# Patient Record
Sex: Male | Born: 1995 | Race: White | Hispanic: No | Marital: Single | Smoking: Never smoker
Health system: Southern US, Community
[De-identification: ages and names within clinical notes are randomized; demographics above are authoritative.]

## PROBLEM LIST (undated history)

## (undated) DIAGNOSIS — F1290 Cannabis use, unspecified, uncomplicated: Secondary | ICD-10-CM

## (undated) DIAGNOSIS — I2699 Other pulmonary embolism without acute cor pulmonale: Secondary | ICD-10-CM

## (undated) DIAGNOSIS — I82409 Acute embolism and thrombosis of unspecified deep veins of unspecified lower extremity: Secondary | ICD-10-CM

## (undated) HISTORY — PX: MASTECTOMY: SHX3

## (undated) HISTORY — PX: OTHER SURGICAL HISTORY: SHX169

---

## 2019-01-24 ENCOUNTER — Emergency Department (HOSPITAL_COMMUNITY): Payer: Self-pay

## 2019-01-24 ENCOUNTER — Inpatient Hospital Stay (HOSPITAL_COMMUNITY): Payer: Self-pay

## 2019-01-24 ENCOUNTER — Encounter (HOSPITAL_COMMUNITY): Payer: Self-pay | Admitting: Emergency Medicine

## 2019-01-24 ENCOUNTER — Other Ambulatory Visit: Payer: Self-pay

## 2019-01-24 ENCOUNTER — Inpatient Hospital Stay (HOSPITAL_COMMUNITY)
Admission: EM | Admit: 2019-01-24 | Discharge: 2019-01-30 | DRG: 175 | Disposition: A | Discharge status: Home / Self Care | Payer: Self-pay | Attending: Family Medicine | Admitting: Family Medicine

## 2019-01-24 DIAGNOSIS — Y990 Civilian activity done for income or pay: Secondary | ICD-10-CM

## 2019-01-24 DIAGNOSIS — N179 Acute kidney failure, unspecified: Secondary | ICD-10-CM | POA: Diagnosis present

## 2019-01-24 DIAGNOSIS — W19XXXA Unspecified fall, initial encounter: Secondary | ICD-10-CM | POA: Diagnosis present

## 2019-01-24 DIAGNOSIS — I2602 Saddle embolus of pulmonary artery with acute cor pulmonale: Secondary | ICD-10-CM

## 2019-01-24 DIAGNOSIS — I82409 Acute embolism and thrombosis of unspecified deep veins of unspecified lower extremity: Secondary | ICD-10-CM | POA: Diagnosis present

## 2019-01-24 DIAGNOSIS — E669 Obesity, unspecified: Secondary | ICD-10-CM | POA: Diagnosis present

## 2019-01-24 DIAGNOSIS — I2699 Other pulmonary embolism without acute cor pulmonale: Secondary | ICD-10-CM | POA: Diagnosis present

## 2019-01-24 DIAGNOSIS — I371 Nonrheumatic pulmonary valve insufficiency: Secondary | ICD-10-CM

## 2019-01-24 DIAGNOSIS — R55 Syncope and collapse: Secondary | ICD-10-CM | POA: Diagnosis present

## 2019-01-24 DIAGNOSIS — I82412 Acute embolism and thrombosis of left femoral vein: Secondary | ICD-10-CM | POA: Diagnosis present

## 2019-01-24 DIAGNOSIS — Z6833 Body mass index (BMI) 33.0-33.9, adult: Secondary | ICD-10-CM

## 2019-01-24 DIAGNOSIS — F1290 Cannabis use, unspecified, uncomplicated: Secondary | ICD-10-CM

## 2019-01-24 DIAGNOSIS — I361 Nonrheumatic tricuspid (valve) insufficiency: Secondary | ICD-10-CM

## 2019-01-24 DIAGNOSIS — Z20828 Contact with and (suspected) exposure to other viral communicable diseases: Secondary | ICD-10-CM | POA: Diagnosis present

## 2019-01-24 DIAGNOSIS — R739 Hyperglycemia, unspecified: Secondary | ICD-10-CM | POA: Diagnosis present

## 2019-01-24 DIAGNOSIS — F17210 Nicotine dependence, cigarettes, uncomplicated: Secondary | ICD-10-CM | POA: Diagnosis present

## 2019-01-24 DIAGNOSIS — R0902 Hypoxemia: Secondary | ICD-10-CM | POA: Diagnosis present

## 2019-01-24 DIAGNOSIS — I2609 Other pulmonary embolism with acute cor pulmonale: Principal | ICD-10-CM | POA: Diagnosis present

## 2019-01-24 DIAGNOSIS — S40212A Abrasion of left shoulder, initial encounter: Secondary | ICD-10-CM | POA: Diagnosis present

## 2019-01-24 HISTORY — PX: IR INFUSION THROMBOL ARTERIAL INITIAL (MS): IMG5376

## 2019-01-24 HISTORY — PX: IR ANGIOGRAM PULMONARY BILATERAL SELECTIVE: IMG664

## 2019-01-24 HISTORY — DX: Cannabis use, unspecified, uncomplicated: F12.90

## 2019-01-24 HISTORY — PX: IR ANGIOGRAM SELECTIVE EACH ADDITIONAL VESSEL: IMG667

## 2019-01-24 HISTORY — PX: IR US GUIDE VASC ACCESS RIGHT: IMG2390

## 2019-01-24 LAB — CBC
HCT: 48.5 % (ref 39.0–52.0)
HCT: 46.4 % (ref 39.0–52.0)
Hemoglobin: 16.3 g/dL (ref 13.0–17.0)
Hemoglobin: 15.6 g/dL (ref 13.0–17.0)
MCH: 30.5 pg (ref 26.0–34.0)
MCH: 30.6 pg (ref 26.0–34.0)
MCHC: 33.6 g/dL (ref 30.0–36.0)
MCHC: 33.6 g/dL (ref 30.0–36.0)
MCV: 90.8 fL (ref 80.0–100.0)
MCV: 91 fL (ref 80.0–100.0)
Platelets: 165 10*3/uL (ref 150–400)
Platelets: 204 10*3/uL (ref 150–400)
RBC: 5.34 MIL/uL (ref 4.22–5.81)
RBC: 5.1 MIL/uL (ref 4.22–5.81)
RDW: 13.1 % (ref 11.5–15.5)
RDW: 13.3 % (ref 11.5–15.5)
WBC: 10 10*3/uL (ref 4.0–10.5)
WBC: 8.9 10*3/uL (ref 4.0–10.5)
nRBC: 0 % (ref 0.0–0.2)
nRBC: 0 % (ref 0.0–0.2)

## 2019-01-24 LAB — HEPARIN LEVEL (UNFRACTIONATED): Heparin Unfractionated: 0.23 IU/mL — ABNORMAL LOW (ref 0.30–0.70)

## 2019-01-24 LAB — RAPID URINE DRUG SCREEN, HOSP PERFORMED
Amphetamines: NOT DETECTED
Barbiturates: NOT DETECTED
Benzodiazepines: NOT DETECTED
Cocaine: NOT DETECTED
Opiates: NOT DETECTED
Tetrahydrocannabinol: POSITIVE — AB

## 2019-01-24 LAB — ECHOCARDIOGRAM COMPLETE
Height: 73 in
Weight: 4063.52 oz

## 2019-01-24 LAB — URINALYSIS, ROUTINE W REFLEX MICROSCOPIC
Bilirubin Urine: NEGATIVE
Glucose, UA: 50 mg/dL — AB
Hgb urine dipstick: NEGATIVE
Ketones, ur: NEGATIVE mg/dL
Leukocytes,Ua: NEGATIVE
Nitrite: NEGATIVE
Protein, ur: 100 mg/dL — AB
Specific Gravity, Urine: 1.023 (ref 1.005–1.030)
pH: 5 (ref 5.0–8.0)

## 2019-01-24 LAB — BASIC METABOLIC PANEL
Anion gap: 11 (ref 5–15)
BUN: 12 mg/dL (ref 6–20)
CO2: 22 mmol/L (ref 22–32)
Calcium: 9.2 mg/dL (ref 8.9–10.3)
Chloride: 104 mmol/L (ref 98–111)
Creatinine, Ser: 1.25 mg/dL — ABNORMAL HIGH (ref 0.61–1.24)
GFR calc Af Amer: >60 mL/min (ref >60)
GFR calc non Af Amer: >60 mL/min (ref >60)
Glucose, Bld: 211 mg/dL — ABNORMAL HIGH (ref 70–99)
Potassium: 4.4 mmol/L (ref 3.5–5.1)
Sodium: 137 mmol/L (ref 135–145)

## 2019-01-24 LAB — FIBRINOGEN: Fibrinogen: 314 mg/dL (ref 210–475)

## 2019-01-24 LAB — SARS CORONAVIRUS 2 (TAT 6-24 HRS): SARS Coronavirus 2: NEGATIVE

## 2019-01-24 LAB — D-DIMER, QUANTITATIVE: D-Dimer, Quant: 17.8 ug/mL-FEU — ABNORMAL HIGH (ref 0.00–0.50)

## 2019-01-24 LAB — HEMOGLOBIN A1C
Hgb A1c MFr Bld: 5.4 % (ref 4.8–5.6)
Mean Plasma Glucose: 108.28 mg/dL

## 2019-01-24 LAB — ANTITHROMBIN III: AntiThromb III Func: 101 % (ref 75–120)

## 2019-01-24 LAB — CBG MONITORING, ED: Glucose-Capillary: 178 mg/dL — ABNORMAL HIGH (ref 70–99)

## 2019-01-24 LAB — PROTIME-INR
INR: 1.1 (ref 0.8–1.2)
Prothrombin Time: 14.1 seconds (ref 11.4–15.2)

## 2019-01-24 LAB — APTT: aPTT: 21 seconds — ABNORMAL LOW (ref 24–36)

## 2019-01-24 MED ORDER — CHLORHEXIDINE GLUCONATE CLOTH 2 % EX PADS
6.0000 | MEDICATED_PAD | Freq: Every day | CUTANEOUS | Status: DC
  Administered 2019-01-25 – 2019-01-26 (×2): 6 via TOPICAL

## 2019-01-24 MED ORDER — ACETAMINOPHEN 325 MG PO TABS
650.0000 mg | ORAL_TABLET | Freq: Four times a day (QID) | ORAL | Status: DC | PRN
  Administered 2019-01-24: 650 mg via ORAL
  Filled 2019-01-24: qty 2

## 2019-01-24 MED ORDER — SODIUM CHLORIDE 0.9 % IV SOLN: 250.0000 mL | INTRAVENOUS | Status: DC | PRN

## 2019-01-24 MED ORDER — SODIUM CHLORIDE 0.9 % IV SOLN
12.0000 mg | Freq: Once | INTRAVENOUS | Status: AC
  Administered 2019-01-24: 12 mg via INTRAVENOUS
  Filled 2019-01-24: qty 12

## 2019-01-24 MED ORDER — SODIUM CHLORIDE 0.9% FLUSH
3.0000 mL | Freq: Two times a day (BID) | INTRAVENOUS | Status: DC
  Administered 2019-01-25 – 2019-01-29 (×3): 3 mL via INTRAVENOUS

## 2019-01-24 MED ORDER — FENTANYL CITRATE (PF) 100 MCG/2ML IJ SOLN
INTRAMUSCULAR | Status: AC
  Filled 2019-01-24: qty 2

## 2019-01-24 MED ORDER — SODIUM CHLORIDE 0.9 % IV SOLN
INTRAVENOUS | Status: DC
  Administered 2019-01-25: 08:00:00 via INTRAVENOUS

## 2019-01-24 MED ORDER — IOHEXOL 300 MG/ML  SOLN
50.0000 mL | Freq: Once | INTRAMUSCULAR | Status: AC | PRN
  Administered 2019-01-24: 10 mL via INTRA_ARTERIAL

## 2019-01-24 MED ORDER — MIDAZOLAM HCL 2 MG/2ML IJ SOLN
INTRAMUSCULAR | Status: AC | PRN
  Administered 2019-01-24: 1 mg via INTRAVENOUS

## 2019-01-24 MED ORDER — IOHEXOL 350 MG/ML SOLN
80.0000 mL | Freq: Once | INTRAVENOUS | Status: AC | PRN
  Administered 2019-01-24: 68 mL via INTRAVENOUS

## 2019-01-24 MED ORDER — SODIUM CHLORIDE 0.9 % IV SOLN: INTRAVENOUS | Status: DC

## 2019-01-24 MED ORDER — LIDOCAINE HCL 1 % IJ SOLN
INTRAMUSCULAR | Status: AC
  Filled 2019-01-24: qty 20

## 2019-01-24 MED ORDER — ONDANSETRON HCL 4 MG PO TABS: 4.0000 mg | ORAL_TABLET | Freq: Four times a day (QID) | ORAL | Status: DC | PRN

## 2019-01-24 MED ORDER — ALBUTEROL SULFATE (2.5 MG/3ML) 0.083% IN NEBU: 2.5000 mg | INHALATION_SOLUTION | Freq: Four times a day (QID) | RESPIRATORY_TRACT | Status: DC | PRN

## 2019-01-24 MED ORDER — ACETAMINOPHEN 650 MG RE SUPP: 650.0000 mg | Freq: Four times a day (QID) | RECTAL | Status: DC | PRN

## 2019-01-24 MED ORDER — SODIUM CHLORIDE 0.9% FLUSH: 3.0000 mL | INTRAVENOUS | Status: DC | PRN

## 2019-01-24 MED ORDER — ALTEPLASE 2 MG IJ SOLR
INTRAMUSCULAR | Status: AC
  Filled 2019-01-24: qty 4

## 2019-01-24 MED ORDER — HYDROCODONE-ACETAMINOPHEN 5-325 MG PO TABS
1.0000 | ORAL_TABLET | Freq: Four times a day (QID) | ORAL | Status: DC | PRN
  Administered 2019-01-24 – 2019-01-25 (×2): 1 via ORAL
  Filled 2019-01-24 (×2): qty 1

## 2019-01-24 MED ORDER — LIDOCAINE HCL 1 % IJ SOLN
INTRAMUSCULAR | Status: AC | PRN
  Administered 2019-01-24: 5 mL

## 2019-01-24 MED ORDER — HEPARIN (PORCINE) 25000 UT/250ML-% IV SOLN
1850.0000 [IU]/h | INTRAVENOUS | Status: DC
  Administered 2019-01-24: 1750 [IU]/h via INTRAVENOUS
  Administered 2019-01-24 – 2019-01-28 (×7): 1850 [IU]/h via INTRAVENOUS
  Filled 2019-01-24 (×10): qty 250

## 2019-01-24 MED ORDER — ONDANSETRON HCL 4 MG/2ML IJ SOLN: 4.0000 mg | Freq: Four times a day (QID) | INTRAMUSCULAR | Status: DC | PRN

## 2019-01-24 MED ORDER — HEPARIN BOLUS VIA INFUSION
6500.0000 [IU] | Freq: Once | INTRAVENOUS | Status: AC
  Administered 2019-01-24: 6500 [IU] via INTRAVENOUS
  Filled 2019-01-24: qty 6500

## 2019-01-24 MED ORDER — MIDAZOLAM HCL 2 MG/2ML IJ SOLN
INTRAMUSCULAR | Status: AC
  Filled 2019-01-24: qty 4

## 2019-01-24 MED ORDER — SODIUM CHLORIDE 0.9 % IV BOLUS
1000.0000 mL | Freq: Once | INTRAVENOUS | Status: AC
  Administered 2019-01-24: 1000 mL via INTRAVENOUS

## 2019-01-24 MED ORDER — SODIUM CHLORIDE 0.9% FLUSH
3.0000 mL | Freq: Once | INTRAVENOUS | Status: AC
  Administered 2019-01-24: 3 mL via INTRAVENOUS

## 2019-01-24 MED ORDER — SODIUM CHLORIDE 0.9 % IV SOLN
INTRAVENOUS | Status: DC
  Administered 2019-01-24 – 2019-01-28 (×8): via INTRAVENOUS

## 2019-01-24 MED ORDER — ALTEPLASE 2 MG IJ SOLR
INTRAMUSCULAR | Status: AC | PRN
  Administered 2019-01-24: 4 mg

## 2019-01-24 NOTE — ED Notes (Signed)
Off the unit for EKOS.

## 2019-01-24 NOTE — ED Notes (Signed)
Pt returned from IR. Site clean and dry. Pt complaining of slight pain at incision site. Will medicate using PRN meds

## 2019-01-24 NOTE — Consult Note (Signed)
Chief Complaint: Patient was seen in consultation today for pulmonary embolus  Referring Physician(s): Dr. Delton Coombes  Supervising Physician: Richarda Overlie  Patient Status: [facility] - ED  History of Present Illness: Craig Blankenship is a 23 y.o. male with no significant past medical history who presents to [facility] ED today after syncopal event at work.  Patient states he had 1 week history of shortness of breath.  Similar symptoms were present in a co-worker and he assumed they may have had a viral/respiratory illness.  Today, he was lifting heavy items when he became lightheaded and passed out.  His D-dimer was elevated at 17.8 He denies family history of blood disorders or clots.  He believes he may have had a blood clot in January of this year-- describes calf swelling and pain which self-resolved.  No bleeding, trauma, injury, recent surgery, or cancer.  CTA Chest showed: IMPRESSION: 1. Extensive pulmonary embolus bilaterally. Positive for acute PE with CT evidence of right heart strain (RV/LV Ratio = 1.3) consistent with at least submassive (intermediate risk) PE. The presence of right heart strain has been associated with an increased risk of morbidity and mortality. Please activate Code PE by paging [phone].  2. Prominence of the main pulmonary outflow tract, a finding indicative of pulmonary arterial hypertension.  3. Reflux of contrast in the inferior vena cava and hepatic veins, a finding indicative of increased right heart pressure.  4. Suspected pulmonary infarcts in the lower lobes. There may be an associated degree of atelectasis and possible early pneumonia in these areas.  5.  No evident adenopathy.  Case reviewed by Dr. Lowella Dandy who has discussed with Dr. Delton Coombes.  Patient has initiated heparin.  He is a candidate for lysis if appropriate per CCM.  Past Medical History:  Diagnosis Date   Marijuana use     Past Surgical History:  Procedure Laterality Date    Left knee surgery      Allergies: Patient has no known allergies.  Medications: Prior to Admission medications   Not on File     History reviewed. No pertinent family history.  Social History   Socioeconomic History   Marital status: Single    Spouse name: Not on file   Number of children: Not on file   Years of education: Not on file   Highest education level: Not on file  Occupational History   Not on file  Social Needs   Financial resource strain: Not on file   Food insecurity    Worry: Not on file    Inability: Not on file   Transportation needs    Medical: Not on file    Non-medical: Not on file  Tobacco Use   Smoking status: Never Smoker   Smokeless tobacco: Never Used  Substance and Sexual Activity   Alcohol use: Yes   Drug use: Not Currently   Sexual activity: Not on file  Lifestyle   Physical activity    Days per week: Not on file    Minutes per session: Not on file   Stress: Not on file  Relationships   Social connections    Talks on phone: Not on file    Gets together: Not on file    Attends religious service: Not on file    Active member of club or organization: Not on file    Attends meetings of clubs or organizations: Not on file    Relationship status: Not on file  Other Topics Concern   Not on  file  Social History Narrative   Not on file     Review of Systems: A 12 point ROS discussed and pertinent positives are indicated in the HPI above.  All other systems are negative.  Review of Systems  Constitutional: Negative for fatigue and fever.  Respiratory: Positive for shortness of breath (for 1 week). Negative for cough.   Cardiovascular: Negative for chest pain.  Gastrointestinal: Negative for abdominal pain.  Genitourinary: Negative for dysuria.  Musculoskeletal: Negative for back pain.  Neurological: Positive for syncope and light-headedness.  Psychiatric/Behavioral: Negative for behavioral problems and confusion.      Vital Signs: BP 125/89    Pulse (!) 113    Temp 97.8 F (36.6 C) (Oral)    Resp (!) 21    Ht 6\' 1"  (1.854 m)    Wt 253 lb 15.5 oz (115.2 kg)    SpO2 95%    BMI 33.51 kg/m   Physical Exam Vitals signs and nursing note reviewed.  Constitutional:      General: He is not in acute distress.    Appearance: He is not ill-appearing.  HENT:     Mouth/Throat:     Mouth: Mucous membranes are moist.     Pharynx: Oropharynx is clear.  Cardiovascular:     Rate and Rhythm: Tachycardia present.  Pulmonary:     Effort: No respiratory distress.     Breath sounds: No stridor. No wheezing.     Comments: Tachypnea, shortness of breath Neurological:     Mental Status: He is alert.      MD Evaluation Airway: WNL Heart: WNL Abdomen: WNL Chest/ Lungs: WNL ASA  Classification: 3 Mallampati/Airway Score: Two   Imaging: Ct Head Wo Contrast  Result Date: 01/24/2019 CLINICAL DATA:  Syncope with fall EXAM: CT HEAD WITHOUT CONTRAST TECHNIQUE: Contiguous axial images were obtained from the base of the skull through the vertex without intravenous contrast. COMPARISON:  None. FINDINGS: Brain: The ventricles are normal in size and configuration. There is no intracranial mass, hemorrhage, extra-axial fluid collection, or midline shift. Brain parenchyma appears unremarkable. No acute infarct evident. Vascular: There is no hyperdense vessel. No vascular calcifications are evident. Skull: The bony calvarium appears intact. Sinuses/Orbits: There is a small retention cyst in the right maxillary antrum. There are scattered foci of opacification in ethmoid air cells bilaterally. Orbits appear symmetric bilaterally. Other: Mastoid air cells are clear. IMPRESSION: Areas of paranasal sinus disease. Study otherwise unremarkable. In particular, no intracranial mass or hemorrhage. Brain parenchyma appears unremarkable. Electronically Signed   By: Bretta BangWilliam  Woodruff III M.D.   On: 01/24/2019 13:19   Ct Angio Chest Pe W  And/or Wo Contrast  Result Date: 01/24/2019 CLINICAL DATA:  Chest pain EXAM: CT ANGIOGRAPHY CHEST WITH CONTRAST TECHNIQUE: Multidetector CT imaging of the chest was performed using the standard protocol during bolus administration of intravenous contrast. Multiplanar CT image reconstructions and MIPs were obtained to evaluate the vascular anatomy. CONTRAST:  68mL OMNIPAQUE IOHEXOL 350 MG/ML SOLN COMPARISON:  None. FINDINGS: Cardiovascular: There is a large pulmonary embolus arising from the right main pulmonary artery with extension into multiple upper and lower lobe pulmonary artery branches on the right. On the left, there are multiple pulmonary emboli extending from the distal most aspect of the main pulmonary artery into upper and lower lobe pulmonary artery branches. The right ventricle to left ventricle diameter ratio is 1.3, consistent with right heart strain. There is no demonstrable thoracic aortic aneurysm or dissection. The visualized great vessels appear  unremarkable. There is no pericardial effusion or pericardial thickening. There is prominence of the main pulmonary outflow tract measuring 3.2 cm. Mediastinum/Nodes: Thyroid appears unremarkable. There is no evident thoracic adenopathy by size criteria. There are occasional subcentimeter mediastinal lymph nodes. No esophageal lesions are evident. Lungs/Pleura: There is opacity in the periphery of the right lower lobe as well as a more wedge-shaped focus in the posterior segment of the right lower lobe. A similar wedge-shaped opacity is noted in the lateral segment left lower lobe. Suspect pulmonary infarcts in the lung bases. There may be a degree of associated atelectasis and possible early pneumonia. No pleural effusions evident. Upper Abdomen: There is reflux of contrast into the inferior vena cava and hepatic veins. Visualized upper abdominal structures otherwise appear normal. Musculoskeletal: No blastic or lytic bone lesions. No chest wall  lesions are evident. Review of the MIP images confirms the above findings. IMPRESSION: 1. Extensive pulmonary embolus bilaterally. Positive for acute PE with CT evidence of right heart strain (RV/LV Ratio = 1.3) consistent with at least submassive (intermediate risk) PE. The presence of right heart strain has been associated with an increased risk of morbidity and mortality. Please activate Code PE by paging [phone]. 2. Prominence of the main pulmonary outflow tract, a finding indicative of pulmonary arterial hypertension. 3. Reflux of contrast in the inferior vena cava and hepatic veins, a finding indicative of increased right heart pressure. 4. Suspected pulmonary infarcts in the lower lobes. There may be an associated degree of atelectasis and possible early pneumonia in these areas. 5.  No evident adenopathy. Critical Value/emergent results were called by telephone at the time of interpretation on 01/24/2019 at 1:15 pm to providerSTEPHEN RANCOUR , who verbally acknowledged these results. Electronically Signed   By: Bretta Bang III M.D.   On: 01/24/2019 13:17   Vas [place] Lower Extremity Venous (dvt)  Result Date: 01/24/2019  Lower Venous Study Indications: Pulmonary embolism.  Comparison Study: no prior Performing Technologist: Blanch Media RVS  Examination Guidelines: A complete evaluation includes B-mode imaging, spectral Doppler, color Doppler, and power Doppler as needed of all accessible portions of each vessel. Bilateral testing is considered an integral part of a complete examination. Limited examinations for reoccurring indications may be performed as noted.  +---------+---------------+---------+-----------+----------+--------------+  RIGHT     Compressibility Phasicity Spontaneity Properties Thrombus Aging  +---------+---------------+---------+-----------+----------+--------------+  CFV       Full            Yes       Yes                                     +---------+---------------+---------+-----------+----------+--------------+  SFJ       Full                                                             +---------+---------------+---------+-----------+----------+--------------+  FV Prox   Full                                                             +---------+---------------+---------+-----------+----------+--------------+  FV Mid    Full                                                             +---------+---------------+---------+-----------+----------+--------------+  FV Distal Full                                                             +---------+---------------+---------+-----------+----------+--------------+  PFV       Full                                                             +---------+---------------+---------+-----------+----------+--------------+  POP       Full            Yes       Yes                                    +---------+---------------+---------+-----------+----------+--------------+  PTV       Full                                                             +---------+---------------+---------+-----------+----------+--------------+  PERO      Full                                                             +---------+---------------+---------+-----------+----------+--------------+   +---------+---------------+---------+-----------+----------+--------------+  LEFT      Compressibility Phasicity Spontaneity Properties Thrombus Aging  +---------+---------------+---------+-----------+----------+--------------+  CFV       None            Yes       Yes                    Acute           +---------+---------------+---------+-----------+----------+--------------+  SFJ       Full                                                             +---------+---------------+---------+-----------+----------+--------------+  FV Prox   None  Acute            +---------+---------------+---------+-----------+----------+--------------+  FV Mid    Full                                                             +---------+---------------+---------+-----------+----------+--------------+  FV Distal Full                                                             +---------+---------------+---------+-----------+----------+--------------+  PFV       Full                                                             +---------+---------------+---------+-----------+----------+--------------+  POP       Full            Yes       Yes                                    +---------+---------------+---------+-----------+----------+--------------+  PTV       Full                                                             +---------+---------------+---------+-----------+----------+--------------+  PERO      Full                                                             +---------+---------------+---------+-----------+----------+--------------+     Summary: Right: There is no evidence of deep vein thrombosis in the lower extremity. No cystic structure found in the popliteal fossa. Left: Findings consistent with acute deep vein thrombosis involving the left common femoral vein, and left femoral vein. No cystic structure found in the popliteal fossa.  *See table(s) above for measurements and observations.    Preliminary     Labs:  CBC: Recent Labs    01/24/19 1023  WBC 10.0  HGB 16.3  HCT 48.5  PLT 165    COAGS: Recent Labs    01/24/19 1351  INR 1.1  APTT 21*    BMP: Recent Labs    01/24/19 1023  NA 137  K 4.4  CL 104  CO2 22  GLUCOSE 211*  BUN 12  CALCIUM 9.2  CREATININE 1.25*  GFRNONAA >60  GFRAA >60    LIVER FUNCTION TESTS: No results for input(s): BILITOT, AST, ALT, ALKPHOS, PROT, ALBUMIN in the last 8760 hours.  TUMOR MARKERS: No results for input(s): AFPTM, CEA, CA199, CHROMGRNA in  the last 8760 hours.  Assessment and Plan: Pulmonary  embolus Patient with 1 week history of shortness of breath. Was doing heavy lifting today when he became lightheaded and syncopal.  No history of DVT, but did have calf cramping in January of this year which he did not seek evaluation for.  Imaging shows extensive bilateral PE with CT evidence of heart strain.  Initial plan was for IV heparin overnight with consideration for PE lysis/EKOS if not improved, however his ECHO shows moderate dilation and decision made to proceed with lysis.   Thank you for this interesting consult.  I greatly enjoyed meeting Craig Blankenship and look forward to participating in their care.  A copy of this report was sent to the requesting provider on this date.  Electronically Signed: Docia Barrier, PA 01/24/2019, 6:53 PM   I spent a total of 40 Minutes    in face to face in clinical consultation, greater than 50% of which was counseling/coordinating care for pulmonary embolus.

## 2019-01-24 NOTE — Progress Notes (Signed)
Lower extremity venous has been completed.   Preliminary results in CV Proc.   Abram Sander 01/24/2019 3:10 PM

## 2019-01-24 NOTE — ED Notes (Signed)
Pt CBG was 178, notified Hope(RN)

## 2019-01-24 NOTE — Sedation Documentation (Signed)
Spoke with Craig Blankenship in pt placement. No ICU beds available for admission. States it will be several hours before a bed will be available. Dr. Laurence Ferrari made aware. TPA infusion to be done without EKOS. Pt to return to ED to await bed assignment. ED bridge called and made aware and will save room 32C for pts return post procedure.

## 2019-01-24 NOTE — Procedures (Signed)
Interventional Radiology Procedure Note  Procedure: Bilateral PE catheter directed thrombolysis.  Main PAP elevated at 33 mmHg.  Complications: None  Estimated Blood Loss: None  Recommendations: - to ICE - tPA at 1 mg/hr per catheter for 12 hrs (total 24 mg) - Clear liquids only  Signed,  Criselda Peaches, MD

## 2019-01-24 NOTE — ED Provider Notes (Signed)
[facility]   CSN: [id number] Arrival date & time: 01/24/19  1008     History   Chief Complaint Chief Complaint  Patient presents with  . Loss of Consciousness    HPI Craig Blankenship is a 23 y.o. male.     Patient presents via EMS with episode of syncope.  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  He then sat down to rest and had increasing shortness of breath.  Admits to poor p.o. intake this morning.  No recent vomiting or diarrhea. he did apparently lose consciousness and fell forward striking his head.  Did not have a headache preceding this.  Bystanders reported he was incontinence of feces.  There is no tongue biting or documented seizure activity.  Patient apparently became lightheaded and passed out again on the way to be seen in urgent care before EMS was called.  He denies chest pain but states he has been short of breath for several weeks with was noticed with exertion with minimal activity.  There is been no fever.  He is a chronic cough from smoking which is unchanged.  No leg pain or leg swelling.  No history of blood clots.  States he had syncope when he was [age] or 23 years old and saw Dr. But does not know what was found and never had to take medicine.  States he is not had any episodes of syncope since then.  EMS reported he was cool and hypotensive and hypoxic to 92%.  He denies any drug use.  The history is provided by the patient.  Loss of Consciousness Associated symptoms: headaches and shortness of breath   Associated symptoms: no chest pain, no nausea and no vomiting     History reviewed. No pertinent past medical history.  There are no active problems to display for this patient.   History reviewed. No pertinent surgical history.      Home Medications    Prior to Admission medications   Not on File    Family  History History reviewed. No pertinent family history.  Social History Social History   Tobacco Use  . Smoking status: Never Smoker  . Smokeless tobacco: Never Used  Substance Use Topics  . Alcohol use: Yes  . Drug use: Not Currently     Allergies   Patient has no known allergies.   Review of Systems Review of Systems  Constitutional: Positive for activity change, appetite change and fatigue.  HENT: Negative for congestion and rhinorrhea.   Respiratory: Positive for cough and shortness of breath. Negative for chest tightness.   Cardiovascular: Positive for syncope. Negative for chest pain.  Gastrointestinal: Negative for abdominal pain, nausea and vomiting.  Genitourinary: Negative for dysuria and hematuria.  Musculoskeletal: Negative for arthralgias and myalgias.  Neurological: Positive for syncope and headaches.   all other systems are negative except as noted in the HPI and PMH.     Physical Exam Updated Vital Signs BP 112/77 (BP Location: Left Arm)   Pulse 99   Temp 97.8 F (36.6 C) (Oral)   Resp 18   SpO2 93%   Physical Exam Vitals signs and nursing note reviewed.  Constitutional:      General: He is not in acute distress.    Appearance: He is well-developed. He is obese. He is not ill-appearing.  HENT:  Head: Normocephalic and atraumatic.     Mouth/Throat:     Pharynx: No oropharyngeal exudate.     Comments: No septal hematoma or hemotympanum No tongue biting Eyes:     Conjunctiva/sclera: Conjunctivae normal.     Pupils: Pupils are equal, round, and reactive to light.  Neck:     Musculoskeletal: Normal range of motion and neck supple.     Comments: No meningismus. Cardiovascular:     Rate and Rhythm: Regular rhythm. Tachycardia present.     Heart sounds: Normal heart sounds. No murmur.     Comments: Tachycardic 100 Pulmonary:     Effort: Pulmonary effort is normal. No respiratory distress.     Breath sounds: Normal breath sounds.  Abdominal:      Palpations: Abdomen is soft.     Tenderness: There is no abdominal tenderness. There is no guarding or rebound.  Musculoskeletal: Normal range of motion.        General: No tenderness.     Comments: Abrasion left shoulder  Skin:    General: Skin is warm.  Neurological:     Mental Status: He is alert and oriented to person, place, and time.     Cranial Nerves: No cranial nerve deficit.     Motor: No abnormal muscle tone.     Coordination: Coordination normal.     Comments: No ataxia on finger to nose bilaterally. No pronator drift. 5/5 strength throughout. CN 2-12 intact.Equal grip strength. Sensation intact.   Psychiatric:        Behavior: Behavior normal.      ED Treatments / Results  Labs (all labs ordered are listed, but only abnormal results are displayed) Labs Reviewed  BASIC METABOLIC PANEL - Abnormal; Notable for the following components:      Result Value   Glucose, Bld 211 (*)    Creatinine, Ser 1.25 (*)    All other components within normal limits  URINALYSIS, ROUTINE W REFLEX MICROSCOPIC - Abnormal; Notable for the following components:   APPearance HAZY (*)    Glucose, UA 50 (*)    Protein, ur 100 (*)    Bacteria, UA RARE (*)    All other components within normal limits  D-DIMER, QUANTITATIVE (NOT AT [facility]) - Abnormal; Notable for the following components:   D-Dimer, Quant 17.80 (*)    All other components within normal limits  RAPID URINE DRUG SCREEN, HOSP PERFORMED - Abnormal; Notable for the following components:   Tetrahydrocannabinol POSITIVE (*)    All other components within normal limits  APTT - Abnormal; Notable for the following components:   aPTT 21 (*)    All other components within normal limits  CBG MONITORING, ED - Abnormal; Notable for the following components:   Glucose-Capillary 178 (*)    All other components within normal limits  SARS CORONAVIRUS 2 (TAT 6-24 HRS)  CBC  PROTIME-INR  ANTITHROMBIN III  HEMOGLOBIN A1C  HEPARIN LEVEL  (UNFRACTIONATED)  PROTEIN C ACTIVITY  PROTEIN C, TOTAL  PROTEIN S ACTIVITY  PROTEIN S, TOTAL  LUPUS ANTICOAGULANT PANEL  BETA-2-GLYCOPROTEIN I ABS, IGG/M/A  HOMOCYSTEINE  FACTOR 5 LEIDEN  PROTHROMBIN GENE MUTATION  CARDIOLIPIN ANTIBODIES, IGG, IGM, IGA  HIV ANTIBODY (ROUTINE TESTING W REFLEX)  HEPARIN LEVEL (UNFRACTIONATED)  CBC    EKG EKG Interpretation  Date/Time:  Thursday January 24 2019 10:18:46 EST Ventricular Rate:  104 PR Interval:  168 QRS Duration: 88 QT Interval:  368 QTC Calculation: 483 R Axis:   98 Text Interpretation: Sinus tachycardia  Rightward axis Borderline ECG No old tracing to compare Confirmed by Meridee Score [phone]) on 01/24/2019 10:38:08 AM   Radiology Ct Head Wo Contrast  Result Date: 01/24/2019 CLINICAL DATA:  Syncope with fall EXAM: CT HEAD WITHOUT CONTRAST TECHNIQUE: Contiguous axial images were obtained from the base of the skull through the vertex without intravenous contrast. COMPARISON:  None. FINDINGS: Brain: The ventricles are normal in size and configuration. There is no intracranial mass, hemorrhage, extra-axial fluid collection, or midline shift. Brain parenchyma appears unremarkable. No acute infarct evident. Vascular: There is no hyperdense vessel. No vascular calcifications are evident. Skull: The bony calvarium appears intact. Sinuses/Orbits: There is a small retention cyst in the right maxillary antrum. There are scattered foci of opacification in ethmoid air cells bilaterally. Orbits appear symmetric bilaterally. Other: Mastoid air cells are clear. IMPRESSION: Areas of paranasal sinus disease. Study otherwise unremarkable. In particular, no intracranial mass or hemorrhage. Brain parenchyma appears unremarkable. Electronically Signed   By: Bretta Bang III M.D.   On: 01/24/2019 13:19   Ct Angio Chest Pe W And/or Wo Contrast  Result Date: 01/24/2019 CLINICAL DATA:  Chest pain EXAM: CT ANGIOGRAPHY CHEST WITH CONTRAST TECHNIQUE:  Multidetector CT imaging of the chest was performed using the standard protocol during bolus administration of intravenous contrast. Multiplanar CT image reconstructions and MIPs were obtained to evaluate the vascular anatomy. CONTRAST:  68mL OMNIPAQUE IOHEXOL 350 MG/ML SOLN COMPARISON:  None. FINDINGS: Cardiovascular: There is a large pulmonary embolus arising from the right main pulmonary artery with extension into multiple upper and lower lobe pulmonary artery branches on the right. On the left, there are multiple pulmonary emboli extending from the distal most aspect of the main pulmonary artery into upper and lower lobe pulmonary artery branches. The right ventricle to left ventricle diameter ratio is 1.3, consistent with right heart strain. There is no demonstrable thoracic aortic aneurysm or dissection. The visualized great vessels appear unremarkable. There is no pericardial effusion or pericardial thickening. There is prominence of the main pulmonary outflow tract measuring 3.2 cm. Mediastinum/Nodes: Thyroid appears unremarkable. There is no evident thoracic adenopathy by size criteria. There are occasional subcentimeter mediastinal lymph nodes. No esophageal lesions are evident. Lungs/Pleura: There is opacity in the periphery of the right lower lobe as well as a more wedge-shaped focus in the posterior segment of the right lower lobe. A similar wedge-shaped opacity is noted in the lateral segment left lower lobe. Suspect pulmonary infarcts in the lung bases. There may be a degree of associated atelectasis and possible early pneumonia. No pleural effusions evident. Upper Abdomen: There is reflux of contrast into the inferior vena cava and hepatic veins. Visualized upper abdominal structures otherwise appear normal. Musculoskeletal: No blastic or lytic bone lesions. No chest wall lesions are evident. Review of the MIP images confirms the above findings. IMPRESSION: 1. Extensive pulmonary embolus bilaterally.  Positive for acute PE with CT evidence of right heart strain (RV/LV Ratio = 1.3) consistent with at least submassive (intermediate risk) PE. The presence of right heart strain has been associated with an increased risk of morbidity and mortality. Please activate Code PE by paging [phone]. 2. Prominence of the main pulmonary outflow tract, a finding indicative of pulmonary arterial hypertension. 3. Reflux of contrast in the inferior vena cava and hepatic veins, a finding indicative of increased right heart pressure. 4. Suspected pulmonary infarcts in the lower lobes. There may be an associated degree of atelectasis and possible early pneumonia in these areas. 5.  No evident  adenopathy. Critical Value/emergent results were called by telephone at the time of interpretation on 01/24/2019 at 1:15 pm to providerSTEPHEN Magdeline Prange , who verbally acknowledged these results. Electronically Signed   By: Bretta Bang III M.D.   On: 01/24/2019 13:17   Vas [place] Lower Extremity Venous (dvt)  Result Date: 01/24/2019  Lower Venous Study Indications: Pulmonary embolism.  Comparison Study: no prior Performing Technologist: Blanch Media RVS  Examination Guidelines: A complete evaluation includes B-mode imaging, spectral Doppler, color Doppler, and power Doppler as needed of all accessible portions of each vessel. Bilateral testing is considered an integral part of a complete examination. Limited examinations for reoccurring indications may be performed as noted.  +---------+---------------+---------+-----------+----------+--------------+ RIGHT    CompressibilityPhasicitySpontaneityPropertiesThrombus Aging +---------+---------------+---------+-----------+----------+--------------+ CFV      Full           Yes      Yes                                 +---------+---------------+---------+-----------+----------+--------------+ SFJ      Full                                                         +---------+---------------+---------+-----------+----------+--------------+ FV Prox  Full                                                        +---------+---------------+---------+-----------+----------+--------------+ FV Mid   Full                                                        +---------+---------------+---------+-----------+----------+--------------+ FV DistalFull                                                        +---------+---------------+---------+-----------+----------+--------------+ PFV      Full                                                        +---------+---------------+---------+-----------+----------+--------------+ POP      Full           Yes      Yes                                 +---------+---------------+---------+-----------+----------+--------------+ PTV      Full                                                        +---------+---------------+---------+-----------+----------+--------------+  PERO     Full                                                        +---------+---------------+---------+-----------+----------+--------------+   +---------+---------------+---------+-----------+----------+--------------+ LEFT     CompressibilityPhasicitySpontaneityPropertiesThrombus Aging +---------+---------------+---------+-----------+----------+--------------+ CFV      None           Yes      Yes                  Acute          +---------+---------------+---------+-----------+----------+--------------+ SFJ      Full                                                        +---------+---------------+---------+-----------+----------+--------------+ FV Prox  None                                         Acute          +---------+---------------+---------+-----------+----------+--------------+ FV Mid   Full                                                         +---------+---------------+---------+-----------+----------+--------------+ FV DistalFull                                                        +---------+---------------+---------+-----------+----------+--------------+ PFV      Full                                                        +---------+---------------+---------+-----------+----------+--------------+ POP      Full           Yes      Yes                                 +---------+---------------+---------+-----------+----------+--------------+ PTV      Full                                                        +---------+---------------+---------+-----------+----------+--------------+ PERO     Full                                                        +---------+---------------+---------+-----------+----------+--------------+  Summary: Right: There is no evidence of deep vein thrombosis in the lower extremity. No cystic structure found in the popliteal fossa. Left: Findings consistent with acute deep vein thrombosis involving the left common femoral vein, and left femoral vein. No cystic structure found in the popliteal fossa.  *See table(s) above for measurements and observations.    Preliminary     Procedures Ultrasound ED Echo  Date/Time: 01/24/2019 12:46 PM Performed by: Glynn Octave, MD Authorized by: Glynn Octave, MD   Procedure details:    Indications: dyspnea and syncope     Views: subxiphoid     Images: archived     Limitations:  Body habitus Findings:    Pericardium: no pericardial effusion     Cardiac Activity: hyperdynamic     RV Diameter: dilated     Other signs of RV strain: paradoxical septal motion and RV hypokinesis   Impression:    Impression comment:  R heart strain  .Critical Care Performed by: Glynn Octave, MD Authorized by: Glynn Octave, MD   Critical care provider statement:    Critical care time (minutes):  60   Critical care was necessary to treat  or prevent imminent or life-threatening deterioration of the following conditions:  Circulatory failure   Critical care was time spent personally by me on the following activities:  Discussions with consultants, evaluation of patient's response to treatment, examination of patient, ordering and performing treatments and interventions, ordering and review of laboratory studies, ordering and review of radiographic studies, pulse oximetry, re-evaluation of patient's condition, obtaining history from patient or surrogate and review of old charts   (including critical care time)  Medications Ordered in ED Medications  sodium chloride flush (NS) 0.9 % injection 3 mL (has no administration in time range)  sodium chloride 0.9 % bolus 1,000 mL (has no administration in time range)     Initial Impression / Assessment and Plan / ED Course  I have reviewed the triage vital signs and the nursing notes.  Pertinent labs & imaging results that were available during my care of the patient were reviewed by me and considered in my medical decision making (see chart for details).       Episode of syncope preceded by dizziness and lightheadedness.  Ongoing shortness of breath for several weeks.  Syncope was preceded by prodrome patient has had shortness of breath which is concerning for possible pulmonary embolism.  His EKG shows a pulmonary disease pattern without Brugada or prolonged QT.  Labs show hyperglycemia with no history of diabetes.  No DKA.  Concern for pulmonary embolism causing his syncope, shortness of breath, tachypnea.  He is tachycardic.  There is right heart strain seen on bedside ultrasound.  We will hold heparin until CT head is done given his head trauma.  He sent emergently to CT scan.  D/w Dr. Delton Coombes of critical care who will evaluate.  Request that IR alerted as well for potential catheter-based lytics.  Discussed with Dr. Lowella Dandy.  CT does confirm large PE with R heart strain. D/w Dr.  Margarita Grizzle of radiology. CT head negative for hemorrhage. Heparin gtt started.   Patient has been seen by PCCM and IR.  No plans for lytics at this time.  Plan admission to stepdown unit on hospitalist service. D/w Dr. Katrinka Blazing. Final Clinical Impressions(s) / ED Diagnoses   Final diagnoses:  Other acute pulmonary embolism with acute cor pulmonale [facility])    ED Discharge Orders    None  Glynn Octave, MD 01/24/19 1705

## 2019-01-24 NOTE — Consult Note (Signed)
Chief Complaint: Patient was seen in consultation today for  Chief Complaint  Patient presents with  . Loss of Consciousness   at the request of Jasmine Awe, MD  Referring Physician(s): Dr. Levy Pupa  Patient Status: [facility] - ED  History of Present Illness: Craig Blankenship is a 23 y.o. male who presented to the emergency room after having a syncopal episode at work causing him to fall and hit his head.  CT arteriography of the chest demonstrated extensive large volume bilateral pulmonary emboli with radiographic evidence of right heart strain.  His DVT and subsequent PE appears to be provoked.  He has no active risk factors for DVT.  Clinically, he was quite stable earlier in the emergency department and so the decision was made to proceed with formal echocardiography to assess the degree of underlying right heart dysfunction.  Echocardiography demonstrates impressive right heart strain.  Therefore, we are consulted for possible catheter directed thrombolysis.  I spoke to Craig Blankenship at length.  I explained that he has evidence of right heart strain confirmed with 2 modalities which places him at risk for increased morbidity over the short-term and potential risk for cardiac dysfunction or pulmonary artery serial hypertension in the future.  I described the procedure including the risk of bleeding.  He understands and desires to proceed.  Past Medical History:  Diagnosis Date  . Marijuana use     Past Surgical History:  Procedure Laterality Date  . Left knee surgery      Allergies: Patient has no known allergies.  Medications: Prior to Admission medications   Not on File     History reviewed. No pertinent family history.  Social History   Socioeconomic History  . Marital status: Single    Spouse name: Not on file  . Number of children: Not on file  . Years of education: Not on file  . Highest education level: Not on file  Occupational History  . Not on file   Social Needs  . Financial resource strain: Not on file  . Food insecurity    Worry: Not on file    Inability: Not on file  . Transportation needs    Medical: Not on file    Non-medical: Not on file  Tobacco Use  . Smoking status: Never Smoker  . Smokeless tobacco: Never Used  Substance and Sexual Activity  . Alcohol use: Yes  . Drug use: Not Currently  . Sexual activity: Not on file  Lifestyle  . Physical activity    Days per week: Not on file    Minutes per session: Not on file  . Stress: Not on file  Relationships  . Social Musician on phone: Not on file    Gets together: Not on file    Attends religious service: Not on file    Active member of club or organization: Not on file    Attends meetings of clubs or organizations: Not on file    Relationship status: Not on file  Other Topics Concern  . Not on file  Social History Narrative  . Not on file   Review of Systems: A 12 point ROS discussed and pertinent positives are indicated in the HPI above.  All other systems are negative.  Review of Systems  Vital Signs: BP 125/89   Pulse (!) 113   Temp 97.8 F (36.6 C) (Oral)   Resp (!) 21   Ht 6\' 1"  (1.854 m)   Wt  115.2 kg   SpO2 95%   BMI 33.51 kg/m   Physical Exam Vitals signs and nursing note reviewed.  Constitutional:      Appearance: Normal appearance.  HENT:     Head: Normocephalic and atraumatic.  Eyes:     General: No scleral icterus. Cardiovascular:     Rate and Rhythm: Regular rhythm. Tachycardia present.  Pulmonary:     Effort: Pulmonary effort is normal.  Abdominal:     General: Abdomen is flat.     Palpations: Abdomen is soft.  Skin:    General: Skin is warm and dry.  Neurological:     Mental Status: He is alert and oriented to person, place, and time.  Psychiatric:        Mood and Affect: Mood normal.        Behavior: Behavior normal.     Imaging: Ct Head Wo Contrast  Result Date: 01/24/2019 CLINICAL DATA:  Syncope  with fall EXAM: CT HEAD WITHOUT CONTRAST TECHNIQUE: Contiguous axial images were obtained from the base of the skull through the vertex without intravenous contrast. COMPARISON:  None. FINDINGS: Brain: The ventricles are normal in size and configuration. There is no intracranial mass, hemorrhage, extra-axial fluid collection, or midline shift. Brain parenchyma appears unremarkable. No acute infarct evident. Vascular: There is no hyperdense vessel. No vascular calcifications are evident. Skull: The bony calvarium appears intact. Sinuses/Orbits: There is a small retention cyst in the right maxillary antrum. There are scattered foci of opacification in ethmoid air cells bilaterally. Orbits appear symmetric bilaterally. Other: Mastoid air cells are clear. IMPRESSION: Areas of paranasal sinus disease. Study otherwise unremarkable. In particular, no intracranial mass or hemorrhage. Brain parenchyma appears unremarkable. Electronically Signed   By: Bretta Bang III M.D.   On: 01/24/2019 13:19   Ct Angio Chest Pe W And/or Wo Contrast  Result Date: 01/24/2019 CLINICAL DATA:  Chest pain EXAM: CT ANGIOGRAPHY CHEST WITH CONTRAST TECHNIQUE: Multidetector CT imaging of the chest was performed using the standard protocol during bolus administration of intravenous contrast. Multiplanar CT image reconstructions and MIPs were obtained to evaluate the vascular anatomy. CONTRAST:  68mL OMNIPAQUE IOHEXOL 350 MG/ML SOLN COMPARISON:  None. FINDINGS: Cardiovascular: There is a large pulmonary embolus arising from the right main pulmonary artery with extension into multiple upper and lower lobe pulmonary artery branches on the right. On the left, there are multiple pulmonary emboli extending from the distal most aspect of the main pulmonary artery into upper and lower lobe pulmonary artery branches. The right ventricle to left ventricle diameter ratio is 1.3, consistent with right heart strain. There is no demonstrable thoracic  aortic aneurysm or dissection. The visualized great vessels appear unremarkable. There is no pericardial effusion or pericardial thickening. There is prominence of the main pulmonary outflow tract measuring 3.2 cm. Mediastinum/Nodes: Thyroid appears unremarkable. There is no evident thoracic adenopathy by size criteria. There are occasional subcentimeter mediastinal lymph nodes. No esophageal lesions are evident. Lungs/Pleura: There is opacity in the periphery of the right lower lobe as well as a more wedge-shaped focus in the posterior segment of the right lower lobe. A similar wedge-shaped opacity is noted in the lateral segment left lower lobe. Suspect pulmonary infarcts in the lung bases. There may be a degree of associated atelectasis and possible early pneumonia. No pleural effusions evident. Upper Abdomen: There is reflux of contrast into the inferior vena cava and hepatic veins. Visualized upper abdominal structures otherwise appear normal. Musculoskeletal: No blastic or lytic bone lesions. No  chest wall lesions are evident. Review of the MIP images confirms the above findings. IMPRESSION: 1. Extensive pulmonary embolus bilaterally. Positive for acute PE with CT evidence of right heart strain (RV/LV Ratio = 1.3) consistent with at least submassive (intermediate risk) PE. The presence of right heart strain has been associated with an increased risk of morbidity and mortality. Please activate Code PE by paging [phone]. 2. Prominence of the main pulmonary outflow tract, a finding indicative of pulmonary arterial hypertension. 3. Reflux of contrast in the inferior vena cava and hepatic veins, a finding indicative of increased right heart pressure. 4. Suspected pulmonary infarcts in the lower lobes. There may be an associated degree of atelectasis and possible early pneumonia in these areas. 5.  No evident adenopathy. Critical Value/emergent results were called by telephone at the time of interpretation on  01/24/2019 at 1:15 pm to providerSTEPHEN RANCOUR , who verbally acknowledged these results. Electronically Signed   By: Bretta Bang III M.D.   On: 01/24/2019 13:17   Vas [place] Lower Extremity Venous (dvt)  Result Date: 01/24/2019  Lower Venous Study Indications: Pulmonary embolism.  Comparison Study: no prior Performing Technologist: Blanch Media RVS  Examination Guidelines: A complete evaluation includes B-mode imaging, spectral Doppler, color Doppler, and power Doppler as needed of all accessible portions of each vessel. Bilateral testing is considered an integral part of a complete examination. Limited examinations for reoccurring indications may be performed as noted.  +---------+---------------+---------+-----------+----------+--------------+ RIGHT    CompressibilityPhasicitySpontaneityPropertiesThrombus Aging +---------+---------------+---------+-----------+----------+--------------+ CFV      Full           Yes      Yes                                 +---------+---------------+---------+-----------+----------+--------------+ SFJ      Full                                                        +---------+---------------+---------+-----------+----------+--------------+ FV Prox  Full                                                        +---------+---------------+---------+-----------+----------+--------------+ FV Mid   Full                                                        +---------+---------------+---------+-----------+----------+--------------+ FV DistalFull                                                        +---------+---------------+---------+-----------+----------+--------------+ PFV      Full                                                        +---------+---------------+---------+-----------+----------+--------------+  POP      Full           Yes      Yes                                  +---------+---------------+---------+-----------+----------+--------------+ PTV      Full                                                        +---------+---------------+---------+-----------+----------+--------------+ PERO     Full                                                        +---------+---------------+---------+-----------+----------+--------------+   +---------+---------------+---------+-----------+----------+--------------+ LEFT     CompressibilityPhasicitySpontaneityPropertiesThrombus Aging +---------+---------------+---------+-----------+----------+--------------+ CFV      None           Yes      Yes                  Acute          +---------+---------------+---------+-----------+----------+--------------+ SFJ      Full                                                        +---------+---------------+---------+-----------+----------+--------------+ FV Prox  None                                         Acute          +---------+---------------+---------+-----------+----------+--------------+ FV Mid   Full                                                        +---------+---------------+---------+-----------+----------+--------------+ FV DistalFull                                                        +---------+---------------+---------+-----------+----------+--------------+ PFV      Full                                                        +---------+---------------+---------+-----------+----------+--------------+ POP      Full           Yes      Yes                                 +---------+---------------+---------+-----------+----------+--------------+  PTV      Full                                                        +---------+---------------+---------+-----------+----------+--------------+ PERO     Full                                                         +---------+---------------+---------+-----------+----------+--------------+     Summary: Right: There is no evidence of deep vein thrombosis in the lower extremity. No cystic structure found in the popliteal fossa. Left: Findings consistent with acute deep vein thrombosis involving the left common femoral vein, and left femoral vein. No cystic structure found in the popliteal fossa.  *See table(s) above for measurements and observations.    Preliminary     Labs:  CBC: Recent Labs    01/24/19 1023  WBC 10.0  HGB 16.3  HCT 48.5  PLT 165    COAGS: Recent Labs    01/24/19 1351  INR 1.1  APTT 21*    BMP: Recent Labs    01/24/19 1023  NA 137  K 4.4  CL 104  CO2 22  GLUCOSE 211*  BUN 12  CALCIUM 9.2  CREATININE 1.25*  GFRNONAA >60  GFRAA >60    LIVER FUNCTION TESTS: No results for input(s): BILITOT, AST, ALT, ALKPHOS, PROT, ALBUMIN in the last 8760 hours.  TUMOR MARKERS: No results for input(s): AFPTM, CEA, CA199, CHROMGRNA in the last 8760 hours.  Assessment and Plan:  23 year old male with acute large volume bilateral pulmonary emboli with evidence of right heart strain on both CT angiography and echocardiography.  This constellation is consistent with intermediate-high risk (submassive) PE.  He has no significant risk factors for anticoagulation and remains hemodynamically stable.  He is an excellent candidate for catheter directed thrombolysis.  1.) Pulmonary angiography and bilateral pulmonary arterial thrombolysis.  Thank you for this interesting consult.  I greatly enjoyed meeting Craig Blankenship and look forward to participating in their care.  A copy of this report was sent to the requesting provider on this date.  Electronically Signed: Jacqulynn Cadet, MD 01/24/2019, 6:48 PM   I spent a total of 20 Minutes   n face to face in clinical consultation, greater than 50% of which was counseling/coordinating care for submassive PE with right heart strain.

## 2019-01-24 NOTE — ED Triage Notes (Signed)
Pt arrives via EMS- 3 episodes of syncope at work. Pt has fecal incontinence. Pt also had this happen when he was 13. Pt alert and oriented. Pt complains of SOB and weakness. Original BP 100/60,  BP 113/64 after 510mL NS from EMS. Pt pale, cool. HR 87, 90% on room air. EMS applied 2L [place]- increased to 96%. CBG 251

## 2019-01-24 NOTE — Progress Notes (Signed)
[facility] Progress Note Patient Name: Craig Blankenship DOB: 04-16-1995 MRN: [id number]   Date of Service  01/24/2019  HPI/Events of Note  58 M smoker, cigarettes and marijuana, presents after a syncopal episode while at work with head trauma. CT head negative but CT PA with bilateral PE and right heart strain.   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 Most recent was 2 weeks ago for about 2-3 hours straight. Hypercoagulable workup pending. COVID negative        Judd Lien 01/24/2019, 10:48 PM

## 2019-01-24 NOTE — H&P (Signed)
History and Physical    Craig Blankenship TTS:[id number] DOB: [birth year] DOA: 01/24/2019  Referring MD/NP/PA:  Trina Ao, MD PCP: Patient, No Pcp Per  Patient coming from: via EMS   Chief Complaint: Syncope  I have personally briefly reviewed patient's old medical records in [facility]   HPI: Craig Blankenship is a 23 y.o. male with medical history significant of tobacco and marijuana use.  He presents after reportedly passing out 3 times at work today.  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  During one of the syncopal episodes patient did fall hitting his forehead and was noted to have some fecal incontinence.  He had noticed over the last few days that his left leg where he previously had surgery was giving him pain was larger than his right.  Associated symptoms included complaints of some shortness of breath, chest pressure, and easy bruising.  He normally reports being very active with his job and occasionally play games for prolonged period in time.  Previously, had reports of passing out when he was 13.  In route with EMS initial blood pressure 100/60, heart rate 87,  O2 saturations 90% on room air, and CBG 251.  Patient was given 500 mL of normal saline and placed on 2 L nasal cannula oxygen.  He is unsure if his grandfather had a history of blood clots.   ED Course: The emergency department patient was noted to be tachycardic and tachypneic, but O2 saturations maintained on room air.  Labs significant for WBC 10, creatinine 1.25, glucose 211, and D-dimer 17.8.  UDS positive for marijuana.  CT angiogram of the head showed areas of paraspinous disease, but no other acute findings.  CT angiogram of the chest however did note extensive pulmonary emboli bilaterally with right heart strain equal to 1.3.  PCCM and interventional radiology were consulted.  Patient was started on a heparin drip per pharmacy, and TRH recommended to admit to the stepdown unit.   Review of Systems  Constitutional: Negative for chills, fever and malaise/fatigue.  HENT: Negative for ear discharge and nosebleeds.   Eyes: Negative for photophobia and pain.  Respiratory: Positive for shortness of breath.   Cardiovascular: Positive for chest pain and leg swelling.  Gastrointestinal: Negative for abdominal pain, nausea and vomiting.  Genitourinary: Negative for dysuria and flank pain.  Musculoskeletal: Positive for myalgias.  Skin: Negative for itching.  Neurological: Positive for loss of consciousness. Negative for focal weakness.  Endo/Heme/Allergies: Negative for polydipsia. Bruises/bleeds easily.  Psychiatric/Behavioral: The patient is not nervous/anxious.     History reviewed. No pertinent past medical history.  History reviewed. No pertinent surgical history.   reports that he has never smoked. He has never used smokeless tobacco. He reports current alcohol use. He reports previous drug use.  No Known Allergies  History reviewed. No pertinent family history.  Prior to Admission medications   Not on File    Physical Exam:  Constitutional: Young male currently no acute distress Vitals:   01/24/19 1230 01/24/19 1300 01/24/19 1330 01/24/19 1346  BP: 138/70  123/70 (!) 121/59  Pulse: 94  (!) 104   Resp: (!) 26  (!) 21 (!) 23  Temp:      TempSrc:      SpO2: 97%  98%   Weight:  115.2 kg    Height:  6\' 1"  (1.854 m)     Eyes: PERRL, lids and conjunctivae normal ENMT: Mucous membranes are moist. Posterior pharynx clear of  any exudate or lesions.Normal dentition.  Neck: normal, supple, no masses, no thyromegaly Respiratory: Mildly tachypneic but maintaining O2 saturations on room air..  Cardiovascular: Tachycardic, no murmurs / rubs / gallops.  Left lower extremity 2 times the size of the right lower extremity. 2+ pedal pulses. No carotid bruits.  Abdomen: no tenderness, no masses palpated. No hepatosplenomegaly. Bowel sounds positive.   Musculoskeletal: no clubbing / cyanosis. No joint deformity upper and lower extremities. Good ROM, no contractures. Normal muscle tone.  Skin: no rashes, lesions, ulcers. No induration Neurologic: CN 2-12 grossly intact. Sensation intact, DTR normal. Strength 5/5 in all 4.  Psychiatric: Normal judgment and insight. Alert and oriented x 3. Normal mood.     Labs on Admission: I have personally reviewed following labs and imaging studies  CBC: Recent Labs  Lab 01/24/19 1023  WBC 10.0  HGB 16.3  HCT 48.5  MCV 90.8  PLT 165   Basic Metabolic Panel: Recent Labs  Lab 01/24/19 1023  NA 137  K 4.4  CL 104  CO2 22  GLUCOSE 211*  BUN 12  CREATININE 1.25*  CALCIUM 9.2   GFR: Estimated Creatinine Clearance: 123.2 mL/min (A) (by C-G formula based on SCr of 1.25 mg/dL (H)). Liver Function Tests: No results for input(s): AST, ALT, ALKPHOS, BILITOT, PROT, ALBUMIN in the last 168 hours. No results for input(s): LIPASE, AMYLASE in the last 168 hours. No results for input(s): AMMONIA in the last 168 hours. Coagulation Profile: No results for input(s): INR, PROTIME in the last 168 hours. Cardiac Enzymes: No results for input(s): CKTOTAL, CKMB, CKMBINDEX, TROPONINI in the last 168 hours. BNP (last 3 results) No results for input(s): PROBNP in the last 8760 hours. HbA1C: No results for input(s): HGBA1C in the last 72 hours. CBG: Recent Labs  Lab 01/24/19 1026  GLUCAP 178*   Lipid Profile: No results for input(s): CHOL, HDL, LDLCALC, TRIG, CHOLHDL, LDLDIRECT in the last 72 hours. Thyroid Function Tests: No results for input(s): TSH, T4TOTAL, FREET4, T3FREE, THYROIDAB in the last 72 hours. Anemia Panel: No results for input(s): VITAMINB12, FOLATE, FERRITIN, TIBC, IRON, RETICCTPCT in the last 72 hours. Urine analysis:    Component Value Date/Time   COLORURINE YELLOW 01/24/2019 1120   APPEARANCEUR HAZY (A) 01/24/2019 1120   LABSPEC 1.023 01/24/2019 1120   PHURINE 5.0 01/24/2019  1120   GLUCOSEU 50 (A) 01/24/2019 1120   HGBUR NEGATIVE 01/24/2019 1120   BILIRUBINUR NEGATIVE 01/24/2019 1120   KETONESUR NEGATIVE 01/24/2019 1120   PROTEINUR 100 (A) 01/24/2019 1120   NITRITE NEGATIVE 01/24/2019 1120   LEUKOCYTESUR NEGATIVE 01/24/2019 1120   Sepsis Labs: No results found for this or any previous visit (from the past 240 hour(s)).   Radiological Exams on Admission: Ct Head Wo Contrast  Result Date: 01/24/2019 CLINICAL DATA:  Syncope with fall EXAM: CT HEAD WITHOUT CONTRAST TECHNIQUE: Contiguous axial images were obtained from the base of the skull through the vertex without intravenous contrast. COMPARISON:  None. FINDINGS: Brain: The ventricles are normal in size and configuration. There is no intracranial mass, hemorrhage, extra-axial fluid collection, or midline shift. Brain parenchyma appears unremarkable. No acute infarct evident. Vascular: There is no hyperdense vessel. No vascular calcifications are evident. Skull: The bony calvarium appears intact. Sinuses/Orbits: There is a small retention cyst in the right maxillary antrum. There are scattered foci of opacification in ethmoid air cells bilaterally. Orbits appear symmetric bilaterally. Other: Mastoid air cells are clear. IMPRESSION: Areas of paranasal sinus disease. Study otherwise unremarkable. In particular,  no intracranial mass or hemorrhage. Brain parenchyma appears unremarkable. Electronically Signed   By: Bretta Bang III M.D.   On: 01/24/2019 13:19   Ct Angio Chest Pe W And/or Wo Contrast  Result Date: 01/24/2019 CLINICAL DATA:  Chest pain EXAM: CT ANGIOGRAPHY CHEST WITH CONTRAST TECHNIQUE: Multidetector CT imaging of the chest was performed using the standard protocol during bolus administration of intravenous contrast. Multiplanar CT image reconstructions and MIPs were obtained to evaluate the vascular anatomy. CONTRAST:  68mL OMNIPAQUE IOHEXOL 350 MG/ML SOLN COMPARISON:  None. FINDINGS: Cardiovascular:  There is a large pulmonary embolus arising from the right main pulmonary artery with extension into multiple upper and lower lobe pulmonary artery branches on the right. On the left, there are multiple pulmonary emboli extending from the distal most aspect of the main pulmonary artery into upper and lower lobe pulmonary artery branches. The right ventricle to left ventricle diameter ratio is 1.3, consistent with right heart strain. There is no demonstrable thoracic aortic aneurysm or dissection. The visualized great vessels appear unremarkable. There is no pericardial effusion or pericardial thickening. There is prominence of the main pulmonary outflow tract measuring 3.2 cm. Mediastinum/Nodes: Thyroid appears unremarkable. There is no evident thoracic adenopathy by size criteria. There are occasional subcentimeter mediastinal lymph nodes. No esophageal lesions are evident. Lungs/Pleura: There is opacity in the periphery of the right lower lobe as well as a more wedge-shaped focus in the posterior segment of the right lower lobe. A similar wedge-shaped opacity is noted in the lateral segment left lower lobe. Suspect pulmonary infarcts in the lung bases. There may be a degree of associated atelectasis and possible early pneumonia. No pleural effusions evident. Upper Abdomen: There is reflux of contrast into the inferior vena cava and hepatic veins. Visualized upper abdominal structures otherwise appear normal. Musculoskeletal: No blastic or lytic bone lesions. No chest wall lesions are evident. Review of the MIP images confirms the above findings. IMPRESSION: 1. Extensive pulmonary embolus bilaterally. Positive for acute PE with CT evidence of right heart strain (RV/LV Ratio = 1.3) consistent with at least submassive (intermediate risk) PE. The presence of right heart strain has been associated with an increased risk of morbidity and mortality. Please activate Code PE by paging [phone]. 2. Prominence of the main  pulmonary outflow tract, a finding indicative of pulmonary arterial hypertension. 3. Reflux of contrast in the inferior vena cava and hepatic veins, a finding indicative of increased right heart pressure. 4. Suspected pulmonary infarcts in the lower lobes. There may be an associated degree of atelectasis and possible early pneumonia in these areas. 5.  No evident adenopathy. Critical Value/emergent results were called by telephone at the time of interpretation on 01/24/2019 at 1:15 pm to providerSTEPHEN RANCOUR , who verbally acknowledged these results. Electronically Signed   By: Bretta Bang III M.D.   On: 01/24/2019 13:17    EKG: Independently reviewed.  Sinus tachycardia 104 bpm with right axis deviation.  Assessment/Plan Syncope secondary to bilateral pulmonary embolus and DVT: Acute.  Patient presents after having a syncopal episode and complaints of shortness of breath for weeks.  D-dimer significantly elevated at 17.8. CTA reveals bilateral pulmonary embolus with signs of right heart strain.  PCCM and IR consulted but thrombolytics not advised at this time.  Patient was placed on heparin drip.  Doppler ultrasound revealed acute DVT of the left common femoral vein and left femoral vein.  Echocardiogram revealed signs of significant right heart strain. -Admit to progressive bed -Continuous pulse oximetry  with nasal cannula oxygen as needed -Continue heparin per pharmacy -Follow-up hypercoagulable panel -Strict bedrest  -Appreciate PCCM and IR.  Due to significant signs of right heart strain and residual DVT patient we will be transferred to the ICU for possible need of thrombolytics.  Hyperglycemia: Initial glucose elevated at 211.  Question if secondary to possible acute stress response. -Check hemoglobin A1c   Suspected acute kidney injury versus renal insufficiency: Patient presents with creatinine mildly elevated at 1.25. -Normal saline at 100 mL/h overnight -Recheck creatinine in  a.m.  Marijuana use: Patient reports use of marijuana intermittently. -Continue to counsel on need of cessation of marijuana  DVT prophylaxis: heparin Code Status: full Family Communication: No family present at bedside Disposition Plan: Likely discharge home in 2 to 3 days Consults called: PCCM and interventional radiology Admission status: Inpatient   Norval Morton MD [facility] Pager [phone]   If 7PM-7AM, please contact night-coverage [email or web address] Password [facility]  01/24/2019, 2:27 PM

## 2019-01-24 NOTE — Consult Note (Signed)
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 In ER sats 90% on RA.  CTA chest revealed extensive bilateral PE and concern for R heart strain. PCCM consulted for further recs.   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 Did fall forward during one syncopal event hitting his forehead but denies HA afterward.  CT head neg acute. In ER sats 90% on RA.  CTA chest revealed extensive bilateral PE and concern for R heart strain. PCCM consulted for further recs.  Denies recent travel, personal hx VTE or known family hx thromboembolic disease.  Denies recent periods of prolonged immobility.  He is very active and works most days from 7am till after [employer] / household items.  He does endorse LLE pain from about groin down to knee that started roughly 3 - 4 days prior to ED presentation.  Past Medical History  History reviewed. No pertinent past medical history.   Significant Hospital Events     Consults:    Procedures:    Significant Diagnostic Tests:  CTA chest 11/5 > extensive PE bilaterally with RV / LV 1.3. suspected pulmonary infarcts in lower lobes. CT head 11/5 > neg acute.   Micro Data:  SARS CoV2 11/5 >   Antimicrobials:    Interim history/subjective:  Comfortable on room air, normotensive.  Objective   Blood pressure 123/70, pulse (!) 104, temperature 97.8 F (36.6 C), temperature source Oral, resp. rate (!) 21, height 6\' 1"  (1.854  m), weight 115.2 kg, SpO2 98 %.       No intake or output data in the 24 hours ending 01/24/19 1352 Filed Weights   01/24/19 1300  Weight: 115.2 kg    Examination: General: Young adult male, resting in bed, in NAD. Neuro: A&O x 3, no deficits. HEENT: [place]/AT. Sclerae anicteric.  EOMI. Cardiovascular: RRR, no M/R/G.  Lungs: Respirations even and unlabored.  CTA bilaterally, No W/R/R. Abdomen: BS x 4, soft, NT/ND.  Musculoskeletal: No gross deformities, LLE edema. Skin: Intact, warm, no rashes.   Assessment & Plan:   Extensive bilateral PE - unclear cause and unprovoked.  Pt is smoker, obese otherwise no known risk factors. PLAN -  Ok for SDU admit per TRH  Heparin gtt per pharmacy Assess echo, BLE venous doppler  If echo reveals significant RV strain then can consider EKOS (will discuss with IR who have been consulted by EDP already) Assess hypercoagulable workup  Rest per primary team.   Best practice:  Diet: NPO for now. Pain/Anxiety/Delirium protocol (if indicated): N/A. VAP protocol (if indicated): N/A. DVT prophylaxis: Heparin. GI prophylaxis: N/A. Glucose control: N/A. Mobility: Bedrest. Code Status: Full. Family Communication: None. Disposition: SDU.  Labs   CBC: Recent Labs  Lab 01/24/19 1023  WBC 10.0  HGB 16.3  HCT 48.5  MCV 90.8  PLT 165    Basic Metabolic Panel: Recent Labs  Lab 01/24/19 1023  NA 137  K 4.4  CL 104  CO2 22  GLUCOSE 211*  BUN 12  CREATININE 1.25*  CALCIUM 9.2   GFR: Estimated Creatinine Clearance: 123.2 mL/min (A) (by C-G formula based on SCr of 1.25 mg/dL (H)). Recent Labs  Lab 01/24/19 1023  WBC 10.0    Liver Function Tests: No results for input(s): AST, ALT, ALKPHOS, BILITOT, PROT, ALBUMIN in the last 168 hours. No results for input(s): LIPASE, AMYLASE in the last 168 hours. No results for input(s): AMMONIA in the last 168 hours.  ABG No results found for: PHART, PCO2ART, PO2ART, HCO3, TCO2, ACIDBASEDEF,  O2SAT   Coagulation Profile: No results for input(s): INR, PROTIME in the last 168 hours.  Cardiac Enzymes: No results for input(s): CKTOTAL, CKMB, CKMBINDEX, TROPONINI in the last 168 hours.  HbA1C: No results found for: HGBA1C  CBG: Recent Labs  Lab 01/24/19 1026  GLUCAP 178*    Review of Systems:   As per HPI - All other systems reviewed and were neg.     Past Medical History  He,  has no past medical history on file.   Surgical History   History reviewed. No pertinent surgical history.   Social History   reports that he has never smoked. He has never used smokeless tobacco. He reports current alcohol use. He reports previous drug use.   Family History   His family history is not on file.   Allergies No Known Allergies   Home Medications  Prior to Admission medications   Not on File     Montey Hora, [place] [facility] 01/24/2019, 2:14 PM

## 2019-01-24 NOTE — Progress Notes (Signed)
ANTICOAGULATION CONSULT NOTE - Initial Consult  Pharmacy Consult for  Indication: pulmonary embolus  No Known Allergies  Patient Measurements:   Heparin Dosing Weight: 104.5 kg   Vital Signs: Temp: 97.8 F (36.6 C) (11/05 1022) Temp Source: Oral (11/05 1022) BP: 138/70 (11/05 1230) Pulse Rate: 94 (11/05 1230)  Labs: Recent Labs    01/24/19 1023  HGB 16.3  HCT 48.5  PLT 165  CREATININE 1.25*    CrCl cannot be calculated (Unknown ideal weight.).   Medical History: History reviewed. No pertinent past medical history.  Assessment: 22 yo male presented on 01/24/2019 for 3 episodes of syncope. Pharmacy consulted to dose heparin for pulmonary embolism. CTA on 01/24/2019 was positive for acute PE and CT evidence of right heart strain (RV/LV ratio = 1.3) consistent with at least submassive (intermediate risk) PE. CT head was negative for hemorrhage. Hgb 16.3. Plt 165. No reported bleeding.  Goal of Therapy:  Heparin level 0.3-0.7 units/ml Monitor platelets by anticoagulation protocol: Yes   Plan:  Heparin 6500 units x1  Start heparin 1750 units/hr  Check heparin level at 2030 Monitor daily heparin level, CBC, and S/S of bleeding   Cristela Felt, PharmD PGY1 Pharmacy Resident [employer]: [phone]   01/24/2019,1:20 PM

## 2019-01-24 NOTE — Progress Notes (Signed)
ANTICOAGULATION CONSULT NOTE   Pharmacy Consult for  Indication: pulmonary embolus  No Known Allergies  Patient Measurements: Height: 6\' 1"  (185.4 cm) Weight: 253 lb 15.5 oz (115.2 kg) IBW/kg (Calculated) : 79.9 Heparin Dosing Weight: 104.5 kg   Vital Signs: BP: 141/78 (11/05 2200) Pulse Rate: 92 (11/05 2145)  Labs: Recent Labs    01/24/19 1023 01/24/19 1351 01/24/19 2140  HGB 16.3  --  15.6  HCT 48.5  --  46.4  PLT 165  --  204  APTT  --  21*  --   LABPROT  --  14.1  --   INR  --  1.1  --   HEPARINUNFRC  --   --  0.23*  CREATININE 1.25*  --   --     Estimated Creatinine Clearance: 123.2 mL/min (A) (by C-G formula based on SCr of 1.25 mg/dL (H)).   Medical History: Past Medical History:  Diagnosis Date  . Marijuana use     Assessment: 23 yo male presented on 01/24/2019 for 3 episodes of syncope. Pharmacy consulted to dose heparin for pulmonary embolism. CTA on 01/24/2019 was positive for acute PE and CT evidence of right heart strain (RV/LV ratio = 1.3) consistent with at least submassive (intermediate risk) PE. CT head was negative for hemorrhage. Hgb 16.3. Plt 165. No reported bleeding.  Goal of Therapy:  Heparin level 0.3-0.7 units/ml Monitor platelets by anticoagulation protocol: Yes   Plan:  - Patient is currently undergoing Catheter directed thrombolysis going at 1mg /hr over 12 hours - Patient's heparin level was sub-therapeutic  - With patient currently receiving catheter directed thrombolysis will not bolus patient. - Increase Heparin drip to 1850 units/hr  - Check heparin level in 6 hours  - Would recommend no boluses while patient receiving thrombolysis    Thank you,  Duanne Limerick PharmD. BCPS 01/24/2019,10:37 PM

## 2019-01-25 ENCOUNTER — Inpatient Hospital Stay (HOSPITAL_COMMUNITY): Payer: Self-pay

## 2019-01-25 ENCOUNTER — Encounter (HOSPITAL_COMMUNITY): Payer: Self-pay | Admitting: Interventional Radiology

## 2019-01-25 DIAGNOSIS — R55 Syncope and collapse: Secondary | ICD-10-CM

## 2019-01-25 DIAGNOSIS — I2699 Other pulmonary embolism without acute cor pulmonale: Secondary | ICD-10-CM

## 2019-01-25 DIAGNOSIS — I82412 Acute embolism and thrombosis of left femoral vein: Secondary | ICD-10-CM

## 2019-01-25 DIAGNOSIS — R0902 Hypoxemia: Secondary | ICD-10-CM

## 2019-01-25 HISTORY — PX: IR THROMB F/U EVAL ART/VEN FINAL DAY (MS): IMG5379

## 2019-01-25 LAB — CBC
HCT: 44.6 % (ref 39.0–52.0)
HCT: 41.3 % (ref 39.0–52.0)
HCT: 44.5 % (ref 39.0–52.0)
Hemoglobin: 15.3 g/dL (ref 13.0–17.0)
Hemoglobin: 14.1 g/dL (ref 13.0–17.0)
Hemoglobin: 15.2 g/dL (ref 13.0–17.0)
MCH: 30.7 pg (ref 26.0–34.0)
MCH: 31.1 pg (ref 26.0–34.0)
MCH: 30.7 pg (ref 26.0–34.0)
MCHC: 34.3 g/dL (ref 30.0–36.0)
MCHC: 34.1 g/dL (ref 30.0–36.0)
MCHC: 34.2 g/dL (ref 30.0–36.0)
MCV: 89.4 fL (ref 80.0–100.0)
MCV: 91 fL (ref 80.0–100.0)
MCV: 89.9 fL (ref 80.0–100.0)
Platelets: 157 10*3/uL (ref 150–400)
Platelets: 122 10*3/uL — ABNORMAL LOW (ref 150–400)
Platelets: 126 10*3/uL — ABNORMAL LOW (ref 150–400)
RBC: 4.99 MIL/uL (ref 4.22–5.81)
RBC: 4.54 MIL/uL (ref 4.22–5.81)
RBC: 4.95 MIL/uL (ref 4.22–5.81)
RDW: 13.2 % (ref 11.5–15.5)
RDW: 13.2 % (ref 11.5–15.5)
RDW: 13.2 % (ref 11.5–15.5)
WBC: 8.4 10*3/uL (ref 4.0–10.5)
WBC: 7.3 10*3/uL (ref 4.0–10.5)
WBC: 7 10*3/uL (ref 4.0–10.5)
nRBC: 0 % (ref 0.0–0.2)
nRBC: 0 % (ref 0.0–0.2)
nRBC: 0 % (ref 0.0–0.2)

## 2019-01-25 LAB — HEPARIN LEVEL (UNFRACTIONATED)
Heparin Unfractionated: 0.42 IU/mL (ref 0.30–0.70)
Heparin Unfractionated: 1.24 IU/mL — ABNORMAL HIGH (ref 0.30–0.70)
Heparin Unfractionated: 1.38 IU/mL — ABNORMAL HIGH (ref 0.30–0.70)
Heparin Unfractionated: 0.38 IU/mL (ref 0.30–0.70)
Heparin Unfractionated: 0.39 IU/mL (ref 0.30–0.70)

## 2019-01-25 LAB — FIBRINOGEN
Fibrinogen: 271 mg/dL (ref 210–475)
Fibrinogen: 256 mg/dL (ref 210–475)
Fibrinogen: 302 mg/dL (ref 210–475)

## 2019-01-25 LAB — BETA-2-GLYCOPROTEIN I ABS, IGG/M/A
Beta-2 Glyco I IgG: <9 GPI IgG units (ref 0–20)
Beta-2-Glycoprotein I IgA: <9 GPI IgA units (ref 0–25)
Beta-2-Glycoprotein I IgM: <9 GPI IgM units (ref 0–32)

## 2019-01-25 LAB — HIV ANTIBODY (ROUTINE TESTING W REFLEX): HIV Screen 4th Generation wRfx: NONREACTIVE

## 2019-01-25 LAB — HOMOCYSTEINE: Homocysteine: 12.1 umol/L (ref 0.0–14.5)

## 2019-01-25 LAB — CARDIOLIPIN ANTIBODIES, IGG, IGM, IGA
Anticardiolipin IgA: <9 APL U/mL (ref 0–11)
Anticardiolipin IgG: <9 GPL U/mL (ref 0–14)
Anticardiolipin IgM: 16 MPL U/mL — ABNORMAL HIGH (ref 0–12)

## 2019-01-25 LAB — MRSA PCR SCREENING: MRSA by PCR: NEGATIVE

## 2019-01-25 LAB — GLUCOSE, CAPILLARY: Glucose-Capillary: 90 mg/dL (ref 70–99)

## 2019-01-25 MED ORDER — CHLORHEXIDINE GLUCONATE 4 % EX LIQD
CUTANEOUS | Status: AC
  Administered 2019-01-25: 11:00:00
  Filled 2019-01-25: qty 15

## 2019-01-25 NOTE — Progress Notes (Addendum)
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 In ER sats 90% on RA.  CTA chest revealed extensive bilateral PE and concern for R heart strain. PCCM consulted for further recs.   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 Did fall forward during one syncopal event hitting his forehead but denies HA afterward.  CT head neg acute. In ER sats 90% on RA.  CTA chest revealed extensive bilateral PE and concern for R heart strain. PCCM consulted for further recs.  Denies recent travel, personal hx VTE or known family hx thromboembolic disease.  Denies recent periods of prolonged immobility.  He is very active and works most days from 7am till after [employer] / household items.  He does endorse LLE pain from about groin down to knee that started roughly 3 - 4 days prior to ED presentation.  Past Medical History   Past Medical History:  Diagnosis Date  . Marijuana use     Significant Hospital Events   11/5 > admit, taken for EKOS  Consults:  IR  Procedures:  11/5 > bilateral EKOS  Significant Diagnostic Tests:  CTA chest 11/5 > extensive PE bilaterally with RV / LV 1.3. suspected pulmonary infarcts in lower lobes. CT head 11/5 > neg acute.  Echo 11/5 > EF 65-70%, mod reduced RV systolic function with flattened IV septum, mod RA dilation. LE duplex 11/5 > DVT in LCFV and LFV.  Micro Data:  SARS CoV2 11/5 > neg  Antimicrobials:    Interim  history/subjective:  Comfortable.  No complaints.  Objective   Blood pressure 128/89, pulse 88, temperature 98 F (36.7 C), temperature source Oral, resp. rate 20, height 6\' 1"  (1.854 m), weight 115.2 kg, SpO2 95 %.        Intake/Output Summary (Last 24 hours) at 01/25/2019 0826 Last data filed at 01/25/2019 0700 Gross per 24 hour  Intake 3422.06 ml  Output 550 ml  Net 2872.06 ml   Filed Weights   01/24/19 1300  Weight: 115.2 kg    Examination: General: Young adult male, sitting up in bed, in NAD. Neuro: A&O x 3, no deficits. HEENT: [place]/AT. Sclerae anicteric.  EOMI. Cardiovascular: RRR, no M/R/G.  Lungs: Respirations even and unlabored.  CTA bilaterally, No W/R/R. Abdomen: BS x 4, soft, NT/ND.  Musculoskeletal: No gross deformities, LLE edema. Skin: Intact, warm, no rashes.   Assessment & Plan:   Extensive bilateral PE - unclear cause and unprovoked.  Pt is smoker, obese otherwise no known risk factors.  S/p bilateral catheter directed lysis 11/5. PLAN -  IR following, will evaluate needs for further EKOS Heparin gtt per pharmacy F/u on hypercoagulable workup   Best practice:  Diet: OK to advance diet. Pain/Anxiety/Delirium protocol (if indicated): N/A. VAP protocol (if indicated): N/A. DVT prophylaxis: Heparin. GI prophylaxis: N/A. Glucose control: N/A. Mobility: Bedrest. Code Status: Full. Family Communication: None - pt states he will communicate with them on  his own. Disposition: ICU.   Montey Hora, [facility] 01/25/2019, 8:26 AM  Attending Note:  23 year old male with an unprovoked PE who presents to PCCM for EKOS use to lyse PE.  No events overnight, feels much better after a syncopal episode.  On exam, alert and interactive with clear lungs.  I reviewed chest CT myself, PE noted.  Discussed with PCCM-NP.  PE:  - Continue EKOS for now  - Heparin  Hypoxemia: resolved  - D/C O2  Hypercoagulable state?  -  Hypercoagulable panel ordered  Anticoagulation:  - Start orals once off EKOS  Once off EKOS will transfer to progressive and to [facility] service with PCCM off 11/7  Patient seen and examined, agree with above note.  I dictated the care and orders written for this patient under my direction.  Rush Farmer, M.D. [facility]monary/Critical Care Medicine.

## 2019-01-25 NOTE — Progress Notes (Signed)
Patient arrived on 4E from [facility] on a hospital bed, assessment completed see flowsheet, placed on tele ccmd notified, patient oriented to room and staff, bed in lowest position call bell within reach , CHG bath given @2pm  per 2MW nurse, will continue to monitor.

## 2019-01-25 NOTE — Progress Notes (Signed)
ANTICOAGULATION CONSULT NOTE   Pharmacy Consult for Heparin  Indication: pulmonary embolus, undergoing lysis  No Known Allergies  Patient Measurements: Height: 6\' 1"  (185.4 cm) Weight: 253 lb 15.5 oz (115.2 kg) IBW/kg (Calculated) : 79.9 Heparin Dosing Weight: 104.5 kg   Vital Signs: BP: 128/77 (11/06 0300) Pulse Rate: 90 (11/06 0300)  Labs: Recent Labs    01/24/19 1023 01/24/19 1351 01/24/19 2140 01/25/19 0320  HGB 16.3  --  15.6 15.3  HCT 48.5  --  46.4 44.6  PLT 165  --  204 157  APTT  --  21*  --   --   LABPROT  --  14.1  --   --   INR  --  1.1  --   --   HEPARINUNFRC  --   --  0.23* 0.42  CREATININE 1.25*  --   --   --     Estimated Creatinine Clearance: 123.2 mL/min (A) (by C-G formula based on SCr of 1.25 mg/dL (H)).   Medical History: Past Medical History:  Diagnosis Date  . Marijuana use     Assessment: 23 yo male presented on 01/24/2019 for 3 episodes of syncope. Pharmacy consulted to dose heparin for pulmonary embolism. CTA on 01/24/2019 was positive for acute PE and CT evidence of right heart strain (RV/LV ratio = 1.3) consistent with at least submassive (intermediate risk) PE. CT head was negative for hemorrhage. Hgb 16.3. Plt 165. No reported bleeding.  11/6 AM update:  Heparin level therapeutic x 1 after rate increase  Goal of Therapy:  Heparin level 0.3-0.7 units/ml Monitor platelets by anticoagulation protocol: Yes   Plan:  -Cont heparin at 1850 units/hr -1000 heparin level  Narda Bonds, PharmD, BCPS Clinical Pharmacist Phone: [phone]

## 2019-01-25 NOTE — Progress Notes (Signed)
ANTICOAGULATION CONSULT NOTE   Pharmacy Consult for Heparin  Indication: pulmonary embolus,  No Known Allergies  Patient Measurements: Height: 6\' 1"  (185.4 cm) Weight: 253 lb 15.5 oz (115.2 kg) IBW/kg (Calculated) : 79.9 Heparin Dosing Weight: 104.5 kg   Vital Signs: Temp: 98.3 F (36.8 C) (11/06 1100) Temp Source: Oral (11/06 1100) BP: 126/67 (11/06 1200) Pulse Rate: 80 (11/06 1200)  Labs: Recent Labs    01/24/19 1023 01/24/19 1351  01/24/19 2140 01/25/19 0320 01/25/19 0524 01/25/19 0924 01/25/19 1200  HGB 16.3  --   --  15.6 15.3  --  14.1  --   HCT 48.5  --   --  46.4 44.6  --  41.3  --   PLT 165  --   --  204 157  --  122*  --   APTT  --  21*  --   --   --   --   --   --   LABPROT  --  14.1  --   --   --   --   --   --   INR  --  1.1  --   --   --   --   --   --   HEPARINUNFRC  --   --    < > 0.23* 0.42 1.24* 1.38* 0.38  CREATININE 1.25*  --   --   --   --   --   --   --    < > = values in this interval not displayed.    Estimated Creatinine Clearance: 123.2 mL/min (A) (by C-G formula based on SCr of 1.25 mg/dL (H)).   Medical History: Past Medical History:  Diagnosis Date  . Marijuana use     Assessment: 23 yo male presented on 01/24/2019 for 3 episodes of syncope. Pharmacy consulted to dose heparin for pulmonary embolism. CTA on 01/24/2019 was positive for acute PE and CT evidence of right heart strain (RV/LV ratio = 1.3) consistent with at least submassive (intermediate risk) PE. CT head was negative for hemorrhage.   Pt s/p local alteplase turned off at [facility] on 11/6. Pt currently without separate access for lab draws. Pt unable to receive second access due to no sticks permitted until 24 hours post alteplase therapy. Erroneously elevated HL drawn x 2 from same line running the heparin infusion.  Nurse was able to flush the line well and draw accurate HL at 0.38  Hgb 16.3>14.1. Plt 165>122. No reported bleeding.  Goal of Therapy:  Heparin level 0.3-0.7  units/ml Monitor platelets by anticoagulation protocol: Yes   Plan:  - Cont heparin at 1850 units/hr - Daily HL with AM labs. Monitor for signs and symptoms of bleeding, Hg, PLT.  Onnie Boer; PharmD Candidate

## 2019-01-25 NOTE — Progress Notes (Signed)
Pressures were taken from each sheath prior to removal.  Pressures were 39/11 with a mean of 20 and 25/13 with a mean of 17 respectively.  Sheaths were removed at 10:59 am and hemostasis was achieved at 11:04 am.  Site was bandaged with gauze and tegaderm.  Site was handed off to [employer], corporate).    [place]

## 2019-01-26 LAB — LUPUS ANTICOAGULANT PANEL
DRVVT: 50 s — ABNORMAL HIGH (ref 0.0–47.0)
PTT Lupus Anticoagulant: 32.7 s (ref 0.0–51.9)

## 2019-01-26 LAB — PROTEIN S ACTIVITY: Protein S Activity: 68 % (ref 63–140)

## 2019-01-26 LAB — BASIC METABOLIC PANEL
Anion gap: 8 (ref 5–15)
BUN: 8 mg/dL (ref 6–20)
CO2: 25 mmol/L (ref 22–32)
Calcium: 9 mg/dL (ref 8.9–10.3)
Chloride: 105 mmol/L (ref 98–111)
Creatinine, Ser: 1.07 mg/dL (ref 0.61–1.24)
GFR calc Af Amer: >60 mL/min (ref >60)
GFR calc non Af Amer: >60 mL/min (ref >60)
Glucose, Bld: 96 mg/dL (ref 70–99)
Potassium: 4.3 mmol/L (ref 3.5–5.1)
Sodium: 138 mmol/L (ref 135–145)

## 2019-01-26 LAB — PROTEIN S, TOTAL: Protein S Ag, Total: 112 % (ref 60–150)

## 2019-01-26 LAB — PROTEIN C ACTIVITY: Protein C Activity: 98 % (ref 73–180)

## 2019-01-26 LAB — CBC
HCT: 44 % (ref 39.0–52.0)
Hemoglobin: 14.9 g/dL (ref 13.0–17.0)
MCH: 30.7 pg (ref 26.0–34.0)
MCHC: 33.9 g/dL (ref 30.0–36.0)
MCV: 90.7 fL (ref 80.0–100.0)
Platelets: 134 10*3/uL — ABNORMAL LOW (ref 150–400)
RBC: 4.85 MIL/uL (ref 4.22–5.81)
RDW: 13 % (ref 11.5–15.5)
WBC: 6.6 10*3/uL (ref 4.0–10.5)
nRBC: 0 % (ref 0.0–0.2)

## 2019-01-26 LAB — DRVVT MIX: dRVVT Mix: 41.5 s (ref 0.0–47.0)

## 2019-01-26 LAB — PROTEIN C, TOTAL: Protein C, Total: 72 % (ref 60–150)

## 2019-01-26 LAB — HEPARIN LEVEL (UNFRACTIONATED): Heparin Unfractionated: 0.35 IU/mL (ref 0.30–0.70)

## 2019-01-26 LAB — PHOSPHORUS: Phosphorus: 3.6 mg/dL (ref 2.5–4.6)

## 2019-01-26 LAB — MAGNESIUM: Magnesium: 1.8 mg/dL (ref 1.7–2.4)

## 2019-01-26 NOTE — Progress Notes (Signed)
PROGRESS NOTE    Craig Blankenship  ZOX:[id number] DOB: Aug 16, 1995 DOA: 01/24/2019 PCP: Patient, No Pcp Per   Brief Narrative:  23 year old smoker with no known past medical history admitted to the hospital for syncopal episodes and hypoxia.  CTA revealed extensive bilateral pulmonary embolism with right-sided heart strain.  Also reported of headache, CT the head was negative.  Underwent catheter associated thrombolysis 11/5.  Echocardiogram-65-70% with reduced right RV function.  Lower extremity Dopplers was positive for acute DVT.   Assessment & Plan:   Principal Problem:   Pulmonary embolism (HCC) Active Problems:   DVT of left leg   Syncope and collapse   Hyperglycemia   AKI (acute kidney injury) (HCC)   Marijuana use  Extensive bilateral pulmonary embolism with cor pulmonale Status post catheter directed lysis 11/5 -Continue heparin drip.  IR following -Echocardiogram-65-70% with reduced RV function.  In the future can get limited echo if necessary. -Hypercoagulable work-up sent -Lower extremity Dopplers-positive for DVT. -Supportive care.  Discussed various types of anticoagulation including Coumadin, Eliquis and Xarelto.  Informed him about risks and benefit for each of them, he chooses eventually to be on Xarelto.  DVT prophylaxis: Heparin drip Code Status: Full code Family Communication:   Disposition Plan: Maintain hospital stay at least for 24-48 hours on a IV heparin and then transition him to oral anticoagulation.  Consultants:   Interventional radiology  Procedures:   EKOS-11/5  Subjective: Still complaining of left lower extremity pain and swelling but greatly improved.  Minimal shortness of breath and chest discomfort.  Review of Systems Otherwise negative except as per HPI, including: General: Denies fever, chills, night sweats or unintended weight loss. Resp: Denies cough, wheezing, shortness of breath. Cardiac: Denies chest pain, palpitations,  orthopnea, paroxysmal nocturnal dyspnea. GI: Denies abdominal pain, nausea, vomiting, diarrhea or constipation GU: Denies dysuria, frequency, hesitancy or incontinence MS: Denies muscle aches, joint pain or swelling Neuro: Denies headache, neurologic deficits (focal weakness, numbness, tingling), abnormal gait Psych: Denies anxiety, depression, SI/HI/AVH Skin: Denies new rashes or lesions ID: Denies sick contacts, exotic exposures, travel  Objective: Vitals:   01/25/19 1549 01/25/19 2032 01/26/19 0000 01/26/19 0408  BP: 122/61 (!) 121/58 107/62 (!) 119/55  Pulse: 88 82 94 77  Resp: (!) 23 19 20 12   Temp: 98.2 F (36.8 C) 98.6 F (37 C) 97.6 F (36.4 C) 98.2 F (36.8 C)  TempSrc: Oral Oral Oral Oral  SpO2: 100% 99% 97% 98%  Weight:      Height:        Intake/Output Summary (Last 24 hours) at 01/26/2019 0806 Last data filed at 01/26/2019 0400 Gross per 24 hour  Intake 3285.66 ml  Output 2900 ml  Net 385.66 ml   Filed Weights   01/24/19 1300  Weight: 115.2 kg    Examination:  General exam: Appears calm and comfortable  Respiratory system: Clear to auscultation. Respiratory effort normal. Cardiovascular system: S1 & S2 heard, RRR. No JVD, murmurs, rubs, gallops or clicks. No pedal edema. Gastrointestinal system: Abdomen is nondistended, soft and nontender. No organomegaly or masses felt. Normal bowel sounds heard. Central nervous system: Alert and oriented. No focal neurological deficits. Extremities: Left lower extremity slightly more swollen than the right 1, nonpitting edema. Skin: No rashes, lesions or ulcers Psychiatry: Judgement and insight appear normal. Mood & affect appropriate.     Data Reviewed:   CBC: Recent Labs  Lab 01/24/19 2140 01/25/19 0320 01/25/19 0924 01/25/19 1553 01/26/19 0218  WBC 8.9 8.4 7.3 7.0  6.6  HGB 15.6 15.3 14.1 15.2 14.9  HCT 46.4 44.6 41.3 44.5 44.0  MCV 91.0 89.4 91.0 89.9 90.7  PLT 204 157 122* 126* 134*   Basic Metabolic  Panel: Recent Labs  Lab 01/24/19 1023 01/26/19 0218  NA 137 138  K 4.4 4.3  CL 104 105  CO2 22 25  GLUCOSE 211* 96  BUN 12 8  CREATININE 1.25* 1.07  CALCIUM 9.2 9.0  MG  --  1.8  PHOS  --  3.6   GFR: Estimated Creatinine Clearance: 144 mL/min (by C-G formula based on SCr of 1.07 mg/dL). Liver Function Tests: No results for input(s): AST, ALT, ALKPHOS, BILITOT, PROT, ALBUMIN in the last 168 hours. No results for input(s): LIPASE, AMYLASE in the last 168 hours. No results for input(s): AMMONIA in the last 168 hours. Coagulation Profile: Recent Labs  Lab 01/24/19 1351  INR 1.1   Cardiac Enzymes: No results for input(s): CKTOTAL, CKMB, CKMBINDEX, TROPONINI in the last 168 hours. BNP (last 3 results) No results for input(s): PROBNP in the last 8760 hours. HbA1C: Recent Labs    01/24/19 1546  HGBA1C 5.4   CBG: Recent Labs  Lab 01/24/19 1026 01/25/19 0815  GLUCAP 178* 90   Lipid Profile: No results for input(s): CHOL, HDL, LDLCALC, TRIG, CHOLHDL, LDLDIRECT in the last 72 hours. Thyroid Function Tests: No results for input(s): TSH, T4TOTAL, FREET4, T3FREE, THYROIDAB in the last 72 hours. Anemia Panel: No results for input(s): VITAMINB12, FOLATE, FERRITIN, TIBC, IRON, RETICCTPCT in the last 72 hours. Sepsis Labs: No results for input(s): PROCALCITON, LATICACIDVEN in the last 168 hours.  Recent Results (from the past 240 hour(s))  SARS CORONAVIRUS 2 (TAT 6-24 HRS) Nasopharyngeal Nasopharyngeal Swab     Status: None   Collection Time: 01/24/19  1:51 PM   Specimen: Nasopharyngeal Swab  Result Value Ref Range Status   SARS Coronavirus 2 NEGATIVE NEGATIVE Final    Comment: (NOTE) SARS-CoV-2 target nucleic acids are NOT DETECTED. The SARS-CoV-2 RNA is generally detectable in upper and lower respiratory specimens during the acute phase of infection. Negative results do not preclude SARS-CoV-2 infection, do not rule out co-infections with other pathogens, and should  not be used as the sole basis for treatment or other patient management decisions. Negative results must be combined with clinical observations, patient history, and epidemiological information. The expected result is Negative. Fact Sheet for Patients: HairSlick.no Fact Sheet for Healthcare Providers: quierodirigir.com This test is not yet approved or cleared by the [place] FDA and  has been authorized for detection and/or diagnosis of SARS-CoV-2 by FDA under an Emergency Use Authorization (EUA). This EUA will remain  in effect (meaning this test can be used) for the duration of the COVID-19 declaration under Section 56 4(b)(1) of the Act, 21 U.S.C. section 360bbb-3(b)(1), unless the authorization is terminated or revoked sooner. Performed at [facility], [address], [place], [place] [address]   MRSA PCR Screening     Status: None   Collection Time: 01/24/19 10:27 PM   Specimen: Nasopharyngeal  Result Value Ref Range Status   MRSA by PCR NEGATIVE NEGATIVE Final    Comment:        The GeneXpert MRSA Assay (FDA approved for NASAL specimens only), is one component of a comprehensive MRSA colonization surveillance program. It is not intended to diagnose MRSA infection nor to guide or monitor treatment for MRSA infections. Performed at [facility], [address], [place], [place] [address]  Radiology Studies: Ct Head Wo Contrast  Result Date: 01/24/2019 CLINICAL DATA:  Syncope with fall EXAM: CT HEAD WITHOUT CONTRAST TECHNIQUE: Contiguous axial images were obtained from the base of the skull through the vertex without intravenous contrast. COMPARISON:  None. FINDINGS: Brain: The ventricles are normal in size and configuration. There is no intracranial mass, hemorrhage, extra-axial fluid collection, or midline shift. Brain parenchyma appears unremarkable. No acute infarct evident. Vascular:  There is no hyperdense vessel. No vascular calcifications are evident. Skull: The bony calvarium appears intact. Sinuses/Orbits: There is a small retention cyst in the right maxillary antrum. There are scattered foci of opacification in ethmoid air cells bilaterally. Orbits appear symmetric bilaterally. Other: Mastoid air cells are clear. IMPRESSION: Areas of paranasal sinus disease. Study otherwise unremarkable. In particular, no intracranial mass or hemorrhage. Brain parenchyma appears unremarkable. Electronically Signed   By: Bretta Bang III M.D.   On: 01/24/2019 13:19   Ct Angio Chest Pe W And/or Wo Contrast  Result Date: 01/24/2019 CLINICAL DATA:  Chest pain EXAM: CT ANGIOGRAPHY CHEST WITH CONTRAST TECHNIQUE: Multidetector CT imaging of the chest was performed using the standard protocol during bolus administration of intravenous contrast. Multiplanar CT image reconstructions and MIPs were obtained to evaluate the vascular anatomy. CONTRAST:  68mL OMNIPAQUE IOHEXOL 350 MG/ML SOLN COMPARISON:  None. FINDINGS: Cardiovascular: There is a large pulmonary embolus arising from the right main pulmonary artery with extension into multiple upper and lower lobe pulmonary artery branches on the right. On the left, there are multiple pulmonary emboli extending from the distal most aspect of the main pulmonary artery into upper and lower lobe pulmonary artery branches. The right ventricle to left ventricle diameter ratio is 1.3, consistent with right heart strain. There is no demonstrable thoracic aortic aneurysm or dissection. The visualized great vessels appear unremarkable. There is no pericardial effusion or pericardial thickening. There is prominence of the main pulmonary outflow tract measuring 3.2 cm. Mediastinum/Nodes: Thyroid appears unremarkable. There is no evident thoracic adenopathy by size criteria. There are occasional subcentimeter mediastinal lymph nodes. No esophageal lesions are evident.  Lungs/Pleura: There is opacity in the periphery of the right lower lobe as well as a more wedge-shaped focus in the posterior segment of the right lower lobe. A similar wedge-shaped opacity is noted in the lateral segment left lower lobe. Suspect pulmonary infarcts in the lung bases. There may be a degree of associated atelectasis and possible early pneumonia. No pleural effusions evident. Upper Abdomen: There is reflux of contrast into the inferior vena cava and hepatic veins. Visualized upper abdominal structures otherwise appear normal. Musculoskeletal: No blastic or lytic bone lesions. No chest wall lesions are evident. Review of the MIP images confirms the above findings. IMPRESSION: 1. Extensive pulmonary embolus bilaterally. Positive for acute PE with CT evidence of right heart strain (RV/LV Ratio = 1.3) consistent with at least submassive (intermediate risk) PE. The presence of right heart strain has been associated with an increased risk of morbidity and mortality. Please activate Code PE by paging [phone]. 2. Prominence of the main pulmonary outflow tract, a finding indicative of pulmonary arterial hypertension. 3. Reflux of contrast in the inferior vena cava and hepatic veins, a finding indicative of increased right heart pressure. 4. Suspected pulmonary infarcts in the lower lobes. There may be an associated degree of atelectasis and possible early pneumonia in these areas. 5.  No evident adenopathy. Critical Value/emergent results were called by telephone at the time of interpretation on 01/24/2019 at 1:15 pm to  providerSTEPHEN RANCOUR , who verbally acknowledged these results. Electronically Signed   By: Bretta Bang III M.D.   On: 01/24/2019 13:17   Ir Angiogram Pulmonary Bilateral Selective  Result Date: 01/25/2019 INDICATION: 23 year old male with unprovoked large volume bilateral pulmonary emboli and evidence of right heart strain on both CT angiography and echocardiography. As such,  he has intermediate-high risk (submassive) pulmonary emboli. He presents now for bilateral catheter directed thrombolysis. EXAM: IR ULTRASOUND GUIDANCE VASC ACCESS RIGHT; ADDITIONAL ARTERIOGRAPHY; IR INFUSION THROMBOL ARTERIAL INITIAL (MS); BILATERAL PULMONARY ARTERIOGRAPHY 1. Ultrasound-guided access right internal jugular vein 2. Second ultrasound-guided access right internal jugular vein 3. Catheterization of the main pulmonary artery with pulmonary arteriogram and pressure measurements 4. Catheterization of the right main pulmonary artery with pulmonary arteriogram 5. Catheterization of the right lower lobe pulmonary artery with initiation of pulmonary arterial thrombolysis 6. Catheterization of the left main pulmonary artery with arteriogram 7. Catheterization of the left lower lobe pulmonary artery 8. Initiation of left pulmonary arterial thrombolysis COMPARISON:  CTA chest 01/24/2019 MEDICATIONS: 2 mg Versed for anxiety lysis. ANESTHESIA/SEDATION: Conscious sedation was not administered. FLUOROSCOPY TIME:  Fluoroscopy Time: 13 minutes 18 seconds (68 mGy). COMPLICATIONS: None immediate. TECHNIQUE: Informed written consent was obtained from the patient after a thorough discussion of the procedural risks, benefits and alternatives. All questions were addressed. Maximal Sterile Barrier Technique was utilized including caps, mask, sterile gowns, sterile gloves, sterile drape, hand hygiene and skin antiseptic. A timeout was performed prior to the initiation of the procedure. The right internal jugular vein was interrogated with ultrasound and found to be widely patent. An image was obtained and stored for the medical record. Local anesthesia was attained by infiltration with 1% lidocaine. A small dermatotomy was made. Under real-time sonographic guidance, the vessel was punctured with a 21 gauge micropuncture needle. Using standard technique, the initial micro needle was exchanged over a 0.018 micro wire for a  transitional 4 [place] micro sheath. The micro sheath was then exchanged over a 0.035 wire for a 6 French vascular sheath. Using the same technique, a second ultrasound-guided puncture was performed in the right internal jugular vein and a second 6 [place] vascular sheath inserted. An angled pigtail catheter was then advanced over a Bentson wire and navigated through the right atrium, right ventricle and into the main pulmonary artery. A pulmonary arteriogram was performed. Extensive bilateral PE are visible. There is relatively poor peripheral pulmonary perfusion. Pressure measurements were then obtained. The main pulmonary arterial mean pressure is 32 mm Hg. The pigtail catheter was then navigated into the right main pulmonary artery. A right pulmonary arteriogram was performed. There is nearly occlusive thrombus within the right main pulmonary artery extending into the truncus anterior and right lower lobe pulmonary artery. Diminished flow is visible peripherally. The angle pigtail catheter was removed over a rose in wire for an angled 5 French catheter. Utilizing a Bentson wire, the wire and catheter were navigated into a right lower lobe pulmonary artery. The catheter was then removed. A 10 cm infusion length Uni fuse infusion catheter was then advanced over the wire and positioned across the thrombus in the right main and right lower lobe pulmonary arteries. A total of 2 mg tPA was then injected through the infusion catheter directly into the thrombus. The catheter was gently flushed and capped. Attention was turned to the second vascular sheath. The angled pigtail catheter was reintroduced over a Bentson wire and navigated into the main pulmonary artery. The catheter was then exchanged over  the rose in wire for the angled 5 French catheter which was used to select the left main pulmonary artery. Arteriography was performed. Significant and nearly occlusive thrombus present in the left lower lobe pulmonary  artery with diminished peripheral perfusion in the lung base. The Bentson wire was advanced distally in the left lower lobe pulmonary artery followed by the catheter. The wire was then exchanged for a rose in wire. The catheter was removed. A second 10 cm infusion length Uni fuse infusion catheter was advanced over the wire and positioned across the thrombus in the left lower lobe pulmonary artery. 2 mg of tPA were then injected through the multi side-hole infusion catheter and into the thrombus. The catheter was gently flushed and capped. Both catheters were secured to the skin with sterile bandages. The catheters were then connected to tPA drip at 1 milligram/hour per catheter. FINDINGS: Main mean pulmonary arterial pressure 33 mm of Hg consistent with pulmonary arterial hypertension. IMPRESSION: 1. Large volume bilateral pulmonary emboli with right heart failure and acute pulmonary arterial hypertension. 2. Successful initiation of bilateral catheter directed pulmonary arterial thrombolysis. Signed, Sterling Big, MD, RPVI [facility] Radiology Electronically Signed   By: Malachy Moan M.D.   On: 01/25/2019 10:00   Ir Angiogram Selective Each Additional Vessel  Result Date: 01/25/2019 INDICATION: 23 year old male with unprovoked large volume bilateral pulmonary emboli and evidence of right heart strain on both CT angiography and echocardiography. As such, he has intermediate-high risk (submassive) pulmonary emboli. He presents now for bilateral catheter directed thrombolysis. EXAM: IR ULTRASOUND GUIDANCE VASC ACCESS RIGHT; ADDITIONAL ARTERIOGRAPHY; IR INFUSION THROMBOL ARTERIAL INITIAL (MS); BILATERAL PULMONARY ARTERIOGRAPHY 1. Ultrasound-guided access right internal jugular vein 2. Second ultrasound-guided access right internal jugular vein 3. Catheterization of the main pulmonary artery with pulmonary arteriogram and pressure measurements 4.  Catheterization of the right main pulmonary artery with pulmonary arteriogram 5. Catheterization of the right lower lobe pulmonary artery with initiation of pulmonary arterial thrombolysis 6. Catheterization of the left main pulmonary artery with arteriogram 7. Catheterization of the left lower lobe pulmonary artery 8. Initiation of left pulmonary arterial thrombolysis COMPARISON:  CTA chest 01/24/2019 MEDICATIONS: 2 mg Versed for anxiety lysis. ANESTHESIA/SEDATION: Conscious sedation was not administered. FLUOROSCOPY TIME:  Fluoroscopy Time: 13 minutes 18 seconds (68 mGy). COMPLICATIONS: None immediate. TECHNIQUE: Informed written consent was obtained from the patient after a thorough discussion of the procedural risks, benefits and alternatives. All questions were addressed. Maximal Sterile Barrier Technique was utilized including caps, mask, sterile gowns, sterile gloves, sterile drape, hand hygiene and skin antiseptic. A timeout was performed prior to the initiation of the procedure. The right internal jugular vein was interrogated with ultrasound and found to be widely patent. An image was obtained and stored for the medical record. Local anesthesia was attained by infiltration with 1% lidocaine. A small dermatotomy was made. Under real-time sonographic guidance, the vessel was punctured with a 21 gauge micropuncture needle. Using standard technique, the initial micro needle was exchanged over a 0.018 micro wire for a transitional 4 [place] micro sheath. The micro sheath was then exchanged over a 0.035 wire for a 6 French vascular sheath. Using the same technique, a second ultrasound-guided puncture was performed in the right internal jugular vein and a second 6 [place] vascular sheath inserted. An angled pigtail catheter was then advanced over a Bentson wire and navigated through the right atrium, right ventricle and into the main pulmonary artery. A pulmonary arteriogram was performed. Extensive  bilateral PE  are visible. There is relatively poor peripheral pulmonary perfusion. Pressure measurements were then obtained. The main pulmonary arterial mean pressure is 32 mm Hg. The pigtail catheter was then navigated into the right main pulmonary artery. A right pulmonary arteriogram was performed. There is nearly occlusive thrombus within the right main pulmonary artery extending into the truncus anterior and right lower lobe pulmonary artery. Diminished flow is visible peripherally. The angle pigtail catheter was removed over a rose in wire for an angled 5 French catheter. Utilizing a Bentson wire, the wire and catheter were navigated into a right lower lobe pulmonary artery. The catheter was then removed. A 10 cm infusion length Uni fuse infusion catheter was then advanced over the wire and positioned across the thrombus in the right main and right lower lobe pulmonary arteries. A total of 2 mg tPA was then injected through the infusion catheter directly into the thrombus. The catheter was gently flushed and capped. Attention was turned to the second vascular sheath. The angled pigtail catheter was reintroduced over a Bentson wire and navigated into the main pulmonary artery. The catheter was then exchanged over the rose in wire for the angled 5 French catheter which was used to select the left main pulmonary artery. Arteriography was performed. Significant and nearly occlusive thrombus present in the left lower lobe pulmonary artery with diminished peripheral perfusion in the lung base. The Bentson wire was advanced distally in the left lower lobe pulmonary artery followed by the catheter. The wire was then exchanged for a rose in wire. The catheter was removed. A second 10 cm infusion length Uni fuse infusion catheter was advanced over the wire and positioned across the thrombus in the left lower lobe pulmonary artery. 2 mg of tPA were then injected through the multi side-hole infusion catheter and into the thrombus. The  catheter was gently flushed and capped. Both catheters were secured to the skin with sterile bandages. The catheters were then connected to tPA drip at 1 milligram/hour per catheter. FINDINGS: Main mean pulmonary arterial pressure 33 mm of Hg consistent with pulmonary arterial hypertension. IMPRESSION: 1. Large volume bilateral pulmonary emboli with right heart failure and acute pulmonary arterial hypertension. 2. Successful initiation of bilateral catheter directed pulmonary arterial thrombolysis. Signed, Sterling Big, MD, RPVI [facility] Radiology Electronically Signed   By: Malachy Moan M.D.   On: 01/25/2019 10:00   Ir Angiogram Selective Each Additional Vessel  Result Date: 01/25/2019 INDICATION: 23 year old male with unprovoked large volume bilateral pulmonary emboli and evidence of right heart strain on both CT angiography and echocardiography. As such, he has intermediate-high risk (submassive) pulmonary emboli. He presents now for bilateral catheter directed thrombolysis. EXAM: IR ULTRASOUND GUIDANCE VASC ACCESS RIGHT; ADDITIONAL ARTERIOGRAPHY; IR INFUSION THROMBOL ARTERIAL INITIAL (MS); BILATERAL PULMONARY ARTERIOGRAPHY 1. Ultrasound-guided access right internal jugular vein 2. Second ultrasound-guided access right internal jugular vein 3. Catheterization of the main pulmonary artery with pulmonary arteriogram and pressure measurements 4. Catheterization of the right main pulmonary artery with pulmonary arteriogram 5. Catheterization of the right lower lobe pulmonary artery with initiation of pulmonary arterial thrombolysis 6. Catheterization of the left main pulmonary artery with arteriogram 7. Catheterization of the left lower lobe pulmonary artery 8. Initiation of left pulmonary arterial thrombolysis COMPARISON:  CTA chest 01/24/2019 MEDICATIONS: 2 mg Versed for anxiety lysis. ANESTHESIA/SEDATION: Conscious sedation was not administered.  FLUOROSCOPY TIME:  Fluoroscopy Time: 13 minutes 18 seconds (68 mGy). COMPLICATIONS: None immediate. TECHNIQUE: Informed written consent  was obtained from the patient after a thorough discussion of the procedural risks, benefits and alternatives. All questions were addressed. Maximal Sterile Barrier Technique was utilized including caps, mask, sterile gowns, sterile gloves, sterile drape, hand hygiene and skin antiseptic. A timeout was performed prior to the initiation of the procedure. The right internal jugular vein was interrogated with ultrasound and found to be widely patent. An image was obtained and stored for the medical record. Local anesthesia was attained by infiltration with 1% lidocaine. A small dermatotomy was made. Under real-time sonographic guidance, the vessel was punctured with a 21 gauge micropuncture needle. Using standard technique, the initial micro needle was exchanged over a 0.018 micro wire for a transitional 4 [place] micro sheath. The micro sheath was then exchanged over a 0.035 wire for a 6 French vascular sheath. Using the same technique, a second ultrasound-guided puncture was performed in the right internal jugular vein and a second 6 [place] vascular sheath inserted. An angled pigtail catheter was then advanced over a Bentson wire and navigated through the right atrium, right ventricle and into the main pulmonary artery. A pulmonary arteriogram was performed. Extensive bilateral PE are visible. There is relatively poor peripheral pulmonary perfusion. Pressure measurements were then obtained. The main pulmonary arterial mean pressure is 32 mm Hg. The pigtail catheter was then navigated into the right main pulmonary artery. A right pulmonary arteriogram was performed. There is nearly occlusive thrombus within the right main pulmonary artery extending into the truncus anterior and right lower lobe pulmonary artery. Diminished flow is visible peripherally. The angle pigtail catheter was  removed over a rose in wire for an angled 5 French catheter. Utilizing a Bentson wire, the wire and catheter were navigated into a right lower lobe pulmonary artery. The catheter was then removed. A 10 cm infusion length Uni fuse infusion catheter was then advanced over the wire and positioned across the thrombus in the right main and right lower lobe pulmonary arteries. A total of 2 mg tPA was then injected through the infusion catheter directly into the thrombus. The catheter was gently flushed and capped. Attention was turned to the second vascular sheath. The angled pigtail catheter was reintroduced over a Bentson wire and navigated into the main pulmonary artery. The catheter was then exchanged over the rose in wire for the angled 5 French catheter which was used to select the left main pulmonary artery. Arteriography was performed. Significant and nearly occlusive thrombus present in the left lower lobe pulmonary artery with diminished peripheral perfusion in the lung base. The Bentson wire was advanced distally in the left lower lobe pulmonary artery followed by the catheter. The wire was then exchanged for a rose in wire. The catheter was removed. A second 10 cm infusion length Uni fuse infusion catheter was advanced over the wire and positioned across the thrombus in the left lower lobe pulmonary artery. 2 mg of tPA were then injected through the multi side-hole infusion catheter and into the thrombus. The catheter was gently flushed and capped. Both catheters were secured to the skin with sterile bandages. The catheters were then connected to tPA drip at 1 milligram/hour per catheter. FINDINGS: Main mean pulmonary arterial pressure 33 mm of Hg consistent with pulmonary arterial hypertension. IMPRESSION: 1. Large volume bilateral pulmonary emboli with right heart failure and acute pulmonary arterial hypertension. 2. Successful initiation of bilateral catheter directed pulmonary arterial thrombolysis.  Signed, Sterling Big, MD, RPVI [facility] Radiology Electronically Signed  By: Malachy Moan M.D.   On: 01/25/2019 10:00   Ir US Guide Vasc Access Right  Result Date: 01/25/2019 INDICATION: 23 year old male with unprovoked large volume bilateral pulmonary emboli and evidence of right heart strain on both CT angiography and echocardiography. As such, he has intermediate-high risk (submassive) pulmonary emboli. He presents now for bilateral catheter directed thrombolysis. EXAM: IR ULTRASOUND GUIDANCE VASC ACCESS RIGHT; ADDITIONAL ARTERIOGRAPHY; IR INFUSION THROMBOL ARTERIAL INITIAL (MS); BILATERAL PULMONARY ARTERIOGRAPHY 1. Ultrasound-guided access right internal jugular vein 2. Second ultrasound-guided access right internal jugular vein 3. Catheterization of the main pulmonary artery with pulmonary arteriogram and pressure measurements 4. Catheterization of the right main pulmonary artery with pulmonary arteriogram 5. Catheterization of the right lower lobe pulmonary artery with initiation of pulmonary arterial thrombolysis 6. Catheterization of the left main pulmonary artery with arteriogram 7. Catheterization of the left lower lobe pulmonary artery 8. Initiation of left pulmonary arterial thrombolysis COMPARISON:  CTA chest 01/24/2019 MEDICATIONS: 2 mg Versed for anxiety lysis. ANESTHESIA/SEDATION: Conscious sedation was not administered. FLUOROSCOPY TIME:  Fluoroscopy Time: 13 minutes 18 seconds (68 mGy). COMPLICATIONS: None immediate. TECHNIQUE: Informed written consent was obtained from the patient after a thorough discussion of the procedural risks, benefits and alternatives. All questions were addressed. Maximal Sterile Barrier Technique was utilized including caps, mask, sterile gowns, sterile gloves, sterile drape, hand hygiene and skin antiseptic. A timeout was performed prior to the initiation of the procedure. The right internal jugular  vein was interrogated with ultrasound and found to be widely patent. An image was obtained and stored for the medical record. Local anesthesia was attained by infiltration with 1% lidocaine. A small dermatotomy was made. Under real-time sonographic guidance, the vessel was punctured with a 21 gauge micropuncture needle. Using standard technique, the initial micro needle was exchanged over a 0.018 micro wire for a transitional 4 [place] micro sheath. The micro sheath was then exchanged over a 0.035 wire for a 6 French vascular sheath. Using the same technique, a second ultrasound-guided puncture was performed in the right internal jugular vein and a second 6 [place] vascular sheath inserted. An angled pigtail catheter was then advanced over a Bentson wire and navigated through the right atrium, right ventricle and into the main pulmonary artery. A pulmonary arteriogram was performed. Extensive bilateral PE are visible. There is relatively poor peripheral pulmonary perfusion. Pressure measurements were then obtained. The main pulmonary arterial mean pressure is 32 mm Hg. The pigtail catheter was then navigated into the right main pulmonary artery. A right pulmonary arteriogram was performed. There is nearly occlusive thrombus within the right main pulmonary artery extending into the truncus anterior and right lower lobe pulmonary artery. Diminished flow is visible peripherally. The angle pigtail catheter was removed over a rose in wire for an angled 5 French catheter. Utilizing a Bentson wire, the wire and catheter were navigated into a right lower lobe pulmonary artery. The catheter was then removed. A 10 cm infusion length Uni fuse infusion catheter was then advanced over the wire and positioned across the thrombus in the right main and right lower lobe pulmonary arteries. A total of 2 mg tPA was then injected through the infusion catheter directly into the thrombus. The catheter was gently flushed and capped.  Attention was turned to the second vascular sheath. The angled pigtail catheter was reintroduced over a Bentson wire and navigated into the main pulmonary artery. The catheter was then exchanged over the rose in wire for the angled 5 French catheter which was  used to select the left main pulmonary artery. Arteriography was performed. Significant and nearly occlusive thrombus present in the left lower lobe pulmonary artery with diminished peripheral perfusion in the lung base. The Bentson wire was advanced distally in the left lower lobe pulmonary artery followed by the catheter. The wire was then exchanged for a rose in wire. The catheter was removed. A second 10 cm infusion length Uni fuse infusion catheter was advanced over the wire and positioned across the thrombus in the left lower lobe pulmonary artery. 2 mg of tPA were then injected through the multi side-hole infusion catheter and into the thrombus. The catheter was gently flushed and capped. Both catheters were secured to the skin with sterile bandages. The catheters were then connected to tPA drip at 1 milligram/hour per catheter. FINDINGS: Main mean pulmonary arterial pressure 33 mm of Hg consistent with pulmonary arterial hypertension. IMPRESSION: 1. Large volume bilateral pulmonary emboli with right heart failure and acute pulmonary arterial hypertension. 2. Successful initiation of bilateral catheter directed pulmonary arterial thrombolysis. Signed, Sterling Big, MD, RPVI [facility] Radiology Electronically Signed   By: Malachy Moan M.D.   On: 01/25/2019 10:00   Vas [place] Lower Extremity Venous (dvt)  Result Date: 01/26/2019  Lower Venous Study Indications: Pulmonary embolism.  Comparison Study: no prior Performing Technologist: Blanch Media RVS  Examination Guidelines: A complete evaluation includes B-mode imaging, spectral Doppler, color Doppler, and power Doppler as needed of all  accessible portions of each vessel. Bilateral testing is considered an integral part of a complete examination. Limited examinations for reoccurring indications may be performed as noted.  +---------+---------------+---------+-----------+----------+--------------+  RIGHT     Compressibility Phasicity Spontaneity Properties Thrombus Aging  +---------+---------------+---------+-----------+----------+--------------+  CFV       Full            Yes       Yes                                    +---------+---------------+---------+-----------+----------+--------------+  SFJ       Full                                                             +---------+---------------+---------+-----------+----------+--------------+  FV Prox   Full                                                             +---------+---------------+---------+-----------+----------+--------------+  FV Mid    Full                                                             +---------+---------------+---------+-----------+----------+--------------+  FV Distal Full                                                             +---------+---------------+---------+-----------+----------+--------------+  PFV       Full                                                             +---------+---------------+---------+-----------+----------+--------------+  POP       Full            Yes       Yes                                    +---------+---------------+---------+-----------+----------+--------------+  PTV       Full                                                             +---------+---------------+---------+-----------+----------+--------------+  PERO      Full                                                             +---------+---------------+---------+-----------+----------+--------------+   +---------+---------------+---------+-----------+----------+--------------+  LEFT      Compressibility Phasicity Spontaneity Properties Thrombus Aging   +---------+---------------+---------+-----------+----------+--------------+  CFV       None            Yes       Yes                    Acute           +---------+---------------+---------+-----------+----------+--------------+  SFJ       Full                                                             +---------+---------------+---------+-----------+----------+--------------+  FV Prox   None                                             Acute           +---------+---------------+---------+-----------+----------+--------------+  FV Mid    Full                                                             +---------+---------------+---------+-----------+----------+--------------+  FV Distal Full                                                             +---------+---------------+---------+-----------+----------+--------------+  PFV       Full                                                             +---------+---------------+---------+-----------+----------+--------------+  POP       Full            Yes       Yes                                    +---------+---------------+---------+-----------+----------+--------------+  PTV       Full                                                             +---------+---------------+---------+-----------+----------+--------------+  PERO      Full                                                             +---------+---------------+---------+-----------+----------+--------------+     Summary: Right: There is no evidence of deep vein thrombosis in the lower extremity. No cystic structure found in the popliteal fossa. Left: Findings consistent with acute deep vein thrombosis involving the left common femoral vein, and left femoral vein. No cystic structure found in the popliteal fossa.  *See table(s) above for measurements and observations. Electronically signed by Gretta Began MD on 01/26/2019 at 7:07:18 AM.    Final    Ir Infusion Thrombol Arterial Initial (ms)  Result Date:  01/25/2019 INDICATION: 23 year old male with unprovoked large volume bilateral pulmonary emboli and evidence of right heart strain on both CT angiography and echocardiography. As such, he has intermediate-high risk (submassive) pulmonary emboli. He presents now for bilateral catheter directed thrombolysis. EXAM: IR ULTRASOUND GUIDANCE VASC ACCESS RIGHT; ADDITIONAL ARTERIOGRAPHY; IR INFUSION THROMBOL ARTERIAL INITIAL (MS); BILATERAL PULMONARY ARTERIOGRAPHY 1. Ultrasound-guided access right internal jugular vein 2. Second ultrasound-guided access right internal jugular vein 3. Catheterization of the main pulmonary artery with pulmonary arteriogram and pressure measurements 4. Catheterization of the right main pulmonary artery with pulmonary arteriogram 5. Catheterization of the right lower lobe pulmonary artery with initiation of pulmonary arterial thrombolysis 6. Catheterization of the left main pulmonary artery with arteriogram 7. Catheterization of the left lower lobe pulmonary artery 8. Initiation of left pulmonary arterial thrombolysis COMPARISON:  CTA chest 01/24/2019 MEDICATIONS: 2 mg Versed for anxiety lysis. ANESTHESIA/SEDATION: Conscious sedation was not administered. FLUOROSCOPY TIME:  Fluoroscopy Time: 13 minutes 18 seconds (68 mGy). COMPLICATIONS: None immediate. TECHNIQUE: Informed written consent was obtained from the patient after a thorough discussion of the procedural risks, benefits and alternatives. All questions were addressed. Maximal Sterile Barrier Technique was utilized including caps, mask, sterile gowns, sterile gloves, sterile drape, hand hygiene and skin antiseptic. A timeout was performed prior to the initiation of the procedure. The right internal jugular vein was interrogated with ultrasound and found to be widely patent.  An image was obtained and stored for the medical record. Local anesthesia was attained by infiltration with 1% lidocaine. A small dermatotomy was made. Under real-time  sonographic guidance, the vessel was punctured with a 21 gauge micropuncture needle. Using standard technique, the initial micro needle was exchanged over a 0.018 micro wire for a transitional 4 [place] micro sheath. The micro sheath was then exchanged over a 0.035 wire for a 6 French vascular sheath. Using the same technique, a second ultrasound-guided puncture was performed in the right internal jugular vein and a second 6 [place] vascular sheath inserted. An angled pigtail catheter was then advanced over a Bentson wire and navigated through the right atrium, right ventricle and into the main pulmonary artery. A pulmonary arteriogram was performed. Extensive bilateral PE are visible. There is relatively poor peripheral pulmonary perfusion. Pressure measurements were then obtained. The main pulmonary arterial mean pressure is 32 mm Hg. The pigtail catheter was then navigated into the right main pulmonary artery. A right pulmonary arteriogram was performed. There is nearly occlusive thrombus within the right main pulmonary artery extending into the truncus anterior and right lower lobe pulmonary artery. Diminished flow is visible peripherally. The angle pigtail catheter was removed over a rose in wire for an angled 5 French catheter. Utilizing a Bentson wire, the wire and catheter were navigated into a right lower lobe pulmonary artery. The catheter was then removed. A 10 cm infusion length Uni fuse infusion catheter was then advanced over the wire and positioned across the thrombus in the right main and right lower lobe pulmonary arteries. A total of 2 mg tPA was then injected through the infusion catheter directly into the thrombus. The catheter was gently flushed and capped. Attention was turned to the second vascular sheath. The angled pigtail catheter was reintroduced over a Bentson wire and navigated into the main pulmonary artery. The catheter was then exchanged over the rose in wire for the angled 5 French  catheter which was used to select the left main pulmonary artery. Arteriography was performed. Significant and nearly occlusive thrombus present in the left lower lobe pulmonary artery with diminished peripheral perfusion in the lung base. The Bentson wire was advanced distally in the left lower lobe pulmonary artery followed by the catheter. The wire was then exchanged for a rose in wire. The catheter was removed. A second 10 cm infusion length Uni fuse infusion catheter was advanced over the wire and positioned across the thrombus in the left lower lobe pulmonary artery. 2 mg of tPA were then injected through the multi side-hole infusion catheter and into the thrombus. The catheter was gently flushed and capped. Both catheters were secured to the skin with sterile bandages. The catheters were then connected to tPA drip at 1 milligram/hour per catheter. FINDINGS: Main mean pulmonary arterial pressure 33 mm of Hg consistent with pulmonary arterial hypertension. IMPRESSION: 1. Large volume bilateral pulmonary emboli with right heart failure and acute pulmonary arterial hypertension. 2. Successful initiation of bilateral catheter directed pulmonary arterial thrombolysis. Signed, Criselda Peaches, MD, [place] [facility] Radiology Electronically Signed   By: Jacqulynn Cadet M.D.   On: 01/25/2019 10:00   Ir Infusion Thrombol Arterial Initial (ms)  Result Date: 01/25/2019 INDICATION: 23 year old male with unprovoked large volume bilateral pulmonary emboli and evidence of right heart strain on both CT angiography and echocardiography. As such, he has intermediate-high risk (submassive) pulmonary emboli. He presents now for bilateral catheter directed thrombolysis. EXAM: IR ULTRASOUND GUIDANCE VASC  ACCESS RIGHT; ADDITIONAL ARTERIOGRAPHY; IR INFUSION THROMBOL ARTERIAL INITIAL (MS); BILATERAL PULMONARY ARTERIOGRAPHY 1. Ultrasound-guided access right internal jugular vein  2. Second ultrasound-guided access right internal jugular vein 3. Catheterization of the main pulmonary artery with pulmonary arteriogram and pressure measurements 4. Catheterization of the right main pulmonary artery with pulmonary arteriogram 5. Catheterization of the right lower lobe pulmonary artery with initiation of pulmonary arterial thrombolysis 6. Catheterization of the left main pulmonary artery with arteriogram 7. Catheterization of the left lower lobe pulmonary artery 8. Initiation of left pulmonary arterial thrombolysis COMPARISON:  CTA chest 01/24/2019 MEDICATIONS: 2 mg Versed for anxiety lysis. ANESTHESIA/SEDATION: Conscious sedation was not administered. FLUOROSCOPY TIME:  Fluoroscopy Time: 13 minutes 18 seconds (68 mGy). COMPLICATIONS: None immediate. TECHNIQUE: Informed written consent was obtained from the patient after a thorough discussion of the procedural risks, benefits and alternatives. All questions were addressed. Maximal Sterile Barrier Technique was utilized including caps, mask, sterile gowns, sterile gloves, sterile drape, hand hygiene and skin antiseptic. A timeout was performed prior to the initiation of the procedure. The right internal jugular vein was interrogated with ultrasound and found to be widely patent. An image was obtained and stored for the medical record. Local anesthesia was attained by infiltration with 1% lidocaine. A small dermatotomy was made. Under real-time sonographic guidance, the vessel was punctured with a 21 gauge micropuncture needle. Using standard technique, the initial micro needle was exchanged over a 0.018 micro wire for a transitional 4 [place] micro sheath. The micro sheath was then exchanged over a 0.035 wire for a 6 French vascular sheath. Using the same technique, a second ultrasound-guided puncture was performed in the right internal jugular vein and a second 6 [place] vascular sheath inserted. An angled pigtail catheter was then advanced over a  Bentson wire and navigated through the right atrium, right ventricle and into the main pulmonary artery. A pulmonary arteriogram was performed. Extensive bilateral PE are visible. There is relatively poor peripheral pulmonary perfusion. Pressure measurements were then obtained. The main pulmonary arterial mean pressure is 32 mm Hg. The pigtail catheter was then navigated into the right main pulmonary artery. A right pulmonary arteriogram was performed. There is nearly occlusive thrombus within the right main pulmonary artery extending into the truncus anterior and right lower lobe pulmonary artery. Diminished flow is visible peripherally. The angle pigtail catheter was removed over a rose in wire for an angled 5 French catheter. Utilizing a Bentson wire, the wire and catheter were navigated into a right lower lobe pulmonary artery. The catheter was then removed. A 10 cm infusion length Uni fuse infusion catheter was then advanced over the wire and positioned across the thrombus in the right main and right lower lobe pulmonary arteries. A total of 2 mg tPA was then injected through the infusion catheter directly into the thrombus. The catheter was gently flushed and capped. Attention was turned to the second vascular sheath. The angled pigtail catheter was reintroduced over a Bentson wire and navigated into the main pulmonary artery. The catheter was then exchanged over the rose in wire for the angled 5 French catheter which was used to select the left main pulmonary artery. Arteriography was performed. Significant and nearly occlusive thrombus present in the left lower lobe pulmonary artery with diminished peripheral perfusion in the lung base. The Bentson wire was advanced distally in the left lower lobe pulmonary artery followed by the catheter. The wire was then exchanged for a rose in wire. The catheter was removed. A second 10  cm infusion length Uni fuse infusion catheter was advanced over the wire and  positioned across the thrombus in the left lower lobe pulmonary artery. 2 mg of tPA were then injected through the multi side-hole infusion catheter and into the thrombus. The catheter was gently flushed and capped. Both catheters were secured to the skin with sterile bandages. The catheters were then connected to tPA drip at 1 milligram/hour per catheter. FINDINGS: Main mean pulmonary arterial pressure 33 mm of Hg consistent with pulmonary arterial hypertension. IMPRESSION: 1. Large volume bilateral pulmonary emboli with right heart failure and acute pulmonary arterial hypertension. 2. Successful initiation of bilateral catheter directed pulmonary arterial thrombolysis. Signed, Sterling Big, MD, RPVI [facility]ascular and Interventional Radiology Specialists [facility] Radiology Electronically Signed   By: Malachy Moan M.D.   On: 01/25/2019 10:00   Ir Rande Lawman F/u Eval Art/ven Final Day (ms)  Result Date: 01/25/2019 INDICATION: Bilateral acute submassive pulmonary embolism with cor pulmonale. Status post pulmonary arterial thrombolytic therapy with bilateral tPA infusion for 12 hours. EXAM: FOLLOW-UP OF PULMONARY ARTERIAL THROMBOLYTIC THERAPY COMPARISON:  01/24/2019 MEDICATIONS: None. ANESTHESIA/SEDATION: None. FLUOROSCOPY TIME:  None COMPLICATIONS: None immediate. TECHNIQUE: Follow-up pulmonary arterial pressures were made at the bedside via bilateral pulmonary arterial infusion catheters. FINDINGS: Mean pulmonary artery pressure measured yesterday prior to initiation of thrombolytic therapy was 33 mm Hg. On completion of tPA infusion, mean pressures were 17-20 mm Hg. The infusion catheters and sheaths were removed from the jugular insertion sites and hemostasis obtained with manual compression. IMPRESSION: Reduction in mean pulmonary arterial pressures after 12 hour bilateral pulmonary arterial thrombolytic infusion therapy. Mean pulmonary artery pressure was reduced from 33 mm Hg to 17-20 mm Hg after  thrombolytic therapy. Electronically Signed   By: Irish Lack M.D.   On: 01/25/2019 12:17        Scheduled Meds:  Chlorhexidine Gluconate Cloth  6 each Topical Q0600   sodium chloride flush  3 mL Intravenous Q12H   Continuous Infusions:  sodium chloride 100 mL/hr at 01/25/19 2309   sodium chloride     sodium chloride Stopped (01/25/19 1039)   sodium chloride Stopped (01/25/19 1038)   heparin 1,850 Units/hr (01/26/19 0457)     LOS: 2 days   Time spent= 35 mins    Craig Castrejon Joline Maxcy, MD [facility]  If 7PM-7AM, please contact night-coverage  01/26/2019, 8:06 AM

## 2019-01-26 NOTE — Progress Notes (Signed)
ANTICOAGULATION CONSULT NOTE   Pharmacy Consult for Heparin  Indication: pulmonary embolus  No Known Allergies  Patient Measurements: Height: 6\' 1"  (185.4 cm) Weight: 253 lb 15.5 oz (115.2 kg) IBW/kg (Calculated) : 79.9 Heparin Dosing Weight: 104.5 kg   Vital Signs: Temp: 98.2 F (36.8 C) (11/07 0408) Temp Source: Oral (11/07 0408) BP: 119/55 (11/07 0408) Pulse Rate: 77 (11/07 0408)  Labs: Recent Labs    01/24/19 1023 01/24/19 1351  01/25/19 0924 01/25/19 1200 01/25/19 1553 01/26/19 0218  HGB 16.3  --    < > 14.1  --  15.2 14.9  HCT 48.5  --    < > 41.3  --  44.5 44.0  PLT 165  --    < > 122*  --  126* 134*  APTT  --  21*  --   --   --   --   --   LABPROT  --  14.1  --   --   --   --   --   INR  --  1.1  --   --   --   --   --   HEPARINUNFRC  --   --    < > 1.38* 0.38 0.39 0.35  CREATININE 1.25*  --   --   --   --   --  1.07   < > = values in this interval not displayed.    Estimated Creatinine Clearance: 144 mL/min (by C-G formula based on SCr of 1.07 mg/dL).   Medical History: Past Medical History:  Diagnosis Date  . Marijuana use     Assessment: 23 yo male presented on 01/24/2019 for 3 episodes of syncope. Pharmacy consulted to dose heparin for pulmonary embolism. CTA on 01/24/2019 was positive for acute PE and CT evidence of right heart strain (RV/LV ratio = 1.3) consistent with at least submassive (intermediate risk) PE. CT head was negative for hemorrhage.   Patient s/p bilateral catheter directed alteplase turned off at [place] on 11/6. Heparin level 0.35 is therapeutic on heparin 1850 units/hr. Hgb 14.9. Plt 134. No reported bleeding.  Goal of Therapy:  Heparin level 0.3-0.7 units/ml Monitor platelets by anticoagulation protocol: Yes   Plan:  - Cont heparin at 1850 units/hr - Monitor daily heparin level, CBC, and S/S of bleeding  - Follow up transition to oral anticoagulation  Cristela Felt, PharmD PGY1 Pharmacy Resident [employer]: [phone]

## 2019-01-27 LAB — CBC
HCT: 44.8 % (ref 39.0–52.0)
Hemoglobin: 15.3 g/dL (ref 13.0–17.0)
MCH: 30.7 pg (ref 26.0–34.0)
MCHC: 34.2 g/dL (ref 30.0–36.0)
MCV: 90 fL (ref 80.0–100.0)
Platelets: 164 10*3/uL (ref 150–400)
RBC: 4.98 MIL/uL (ref 4.22–5.81)
RDW: 13.1 % (ref 11.5–15.5)
WBC: 6.5 10*3/uL (ref 4.0–10.5)
nRBC: 0 % (ref 0.0–0.2)

## 2019-01-27 LAB — HEPARIN LEVEL (UNFRACTIONATED)
Heparin Unfractionated: <0.1 IU/mL — ABNORMAL LOW (ref 0.30–0.70)
Heparin Unfractionated: 0.32 IU/mL (ref 0.30–0.70)

## 2019-01-27 LAB — CK: Total CK: 93 U/L (ref 49–397)

## 2019-01-27 MED ORDER — WHITE PETROLATUM EX OINT
TOPICAL_OINTMENT | CUTANEOUS | Status: AC
  Filled 2019-01-27: qty 28.35

## 2019-01-27 NOTE — Progress Notes (Signed)
PROGRESS NOTE    Craig Blankenship  ZOX:[id number] DOB: 01/15/1996 DOA: 01/24/2019 PCP: Patient, No Pcp Per   Brief Narrative:  23 year old smoker with no known past medical history admitted to the hospital for syncopal episodes and hypoxia.  CTA revealed extensive bilateral pulmonary embolism with right-sided heart strain.  Also reported of headache, CT the head was negative.  Underwent catheter associated thrombolysis 11/5.  Echocardiogram-65-70% with reduced right RV function.  Lower extremity Dopplers was positive for acute DVT.   Assessment & Plan:   Principal Problem:   Pulmonary embolism (HCC) Active Problems:   DVT of left leg   Syncope and collapse   Hyperglycemia   AKI (acute kidney injury) (HCC)   Marijuana use  Extensive bilateral pulmonary embolism with cor pulmonale Status post catheter directed lysis 11/5 -Continue heparin drip.  IR following  -Echocardiogram-65-70% with reduced RV function.  In the future can get limited echo if necessary. -Hypercoagulable work-up sent  -Lower extremity Dopplers-positive for DVT. -Supportive care. - May obtain a CPK - If claudication present, may check arterial doppler   Discussed various types of anticoagulation including Coumadin, Eliquis and Xarelto.  Informed him about risks and benefit for each of them, he chooses eventually to be on Xarelto.  DVT prophylaxis: Heparin drip Code Status: Full code Family Communication:   Disposition Plan: Maintain hospital stay at least for 24-48 hours on a IV heparin and then transition him to oral anticoagulation.  Consultants:   Interventional radiology  Procedures:   EKOS-11/5  Subjective: Did have a little walk with PT on 11/7. Thinks still has left  lower extremity pain. Thinks Left upper leg swelling is not improved. Denies nay claudication or pain at rest or redness.   Denies any shortness of breath and chest discomfort.  Review of Systems Otherwise negative except as per  HPI, including: General: Denies fever, chills, night sweats or unintended weight loss. Resp: Denies cough, wheezing, shortness of breath. Cardiac: Denies chest pain, palpitations, orthopnea, paroxysmal nocturnal dyspnea. GI: Denies abdominal pain, nausea, vomiting, diarrhea or constipation GU: Denies dysuria, frequency, hesitancy or incontinence MS: Denies muscle aches, joint pain or swelling Neuro: Denies headache, neurologic deficits (focal weakness, numbness, tingling), abnormal gait Psych: Denies anxiety, depression, SI/HI/AVH Skin: Denies new rashes or lesions ID: Denies sick contacts, exotic exposures, travel  Objective: Vitals:   01/26/19 2120 01/26/19 2333 01/27/19 0349 01/27/19 0755  BP: 127/76 121/70 132/72 115/62  Pulse: 75 81 64 74  Resp: 16 20 (!) 23 (!) 21  Temp: 97.9 F (36.6 C) 97.7 F (36.5 C) 97.9 F (36.6 C) 98.1 F (36.7 C)  TempSrc: Oral Oral Oral Oral  SpO2: 97% 96% 97% 98%  Weight:      Height:        Intake/Output Summary (Last 24 hours) at 01/27/2019 1530 Last data filed at 01/27/2019 0907 Gross per 24 hour  Intake 3184.4 ml  Output 600 ml  Net 2584.4 ml   Filed Weights   01/24/19 1300  Weight: 115.2 kg    Examination:  General exam: Appears calm and comfortable  Respiratory system: Clear to auscultation. Respiratory effort normal. Cardiovascular system: S1 & S2 heard, RRR. No JVD, murmurs, rubs, gallops or clicks. No pedal edema. Gastrointestinal system: Abdomen is nondistended, soft and nontender. No organomegaly or masses felt. Normal bowel sounds heard. Central nervous system: Alert and oriented. No focal neurological deficits. Extremities: Left lower extremity slightly more swollen than the right 1, nonpitting edema. Skin: No rashes, lesions or ulcers  Psychiatry: Judgement and insight appear normal. Mood & affect appropriate.     Data Reviewed:   CBC: Recent Labs  Lab 01/25/19 0320 01/25/19 0924 01/25/19 1553 01/26/19 0218  01/27/19 0331  WBC 8.4 7.3 7.0 6.6 6.5  HGB 15.3 14.1 15.2 14.9 15.3  HCT 44.6 41.3 44.5 44.0 44.8  MCV 89.4 91.0 89.9 90.7 90.0  PLT 157 122* 126* 134* 194   Basic Metabolic Panel: Recent Labs  Lab 01/24/19 1023 01/26/19 0218  NA 137 138  K 4.4 4.3  CL 104 105  CO2 22 25  GLUCOSE 211* 96  BUN 12 8  CREATININE 1.25* 1.07  CALCIUM 9.2 9.0  MG  --  1.8  PHOS  --  3.6   GFR: Estimated Creatinine Clearance: 144 mL/min (by C-G formula based on SCr of 1.07 mg/dL). Liver Function Tests: No results for input(s): AST, ALT, ALKPHOS, BILITOT, PROT, ALBUMIN in the last 168 hours. No results for input(s): LIPASE, AMYLASE in the last 168 hours. No results for input(s): AMMONIA in the last 168 hours. Coagulation Profile: Recent Labs  Lab 01/24/19 1351  INR 1.1   Cardiac Enzymes: No results for input(s): CKTOTAL, CKMB, CKMBINDEX, TROPONINI in the last 168 hours. BNP (last 3 results) No results for input(s): PROBNP in the last 8760 hours. HbA1C: Recent Labs    01/24/19 1546  HGBA1C 5.4   CBG: Recent Labs  Lab 01/24/19 1026 01/25/19 0815  GLUCAP 178* 90   Lipid Profile: No results for input(s): CHOL, HDL, LDLCALC, TRIG, CHOLHDL, LDLDIRECT in the last 72 hours. Thyroid Function Tests: No results for input(s): TSH, T4TOTAL, FREET4, T3FREE, THYROIDAB in the last 72 hours. Anemia Panel: No results for input(s): VITAMINB12, FOLATE, FERRITIN, TIBC, IRON, RETICCTPCT in the last 72 hours. Sepsis Labs: No results for input(s): PROCALCITON, LATICACIDVEN in the last 168 hours.  Recent Results (from the past 240 hour(s))  SARS CORONAVIRUS 2 (TAT 6-24 HRS) Nasopharyngeal Nasopharyngeal Swab     Status: None   Collection Time: 01/24/19  1:51 PM   Specimen: Nasopharyngeal Swab  Result Value Ref Range Status   SARS Coronavirus 2 NEGATIVE NEGATIVE Final    Comment: (NOTE) SARS-CoV-2 target nucleic acids are NOT DETECTED. The SARS-CoV-2 RNA is generally detectable in upper and lower  respiratory specimens during the acute phase of infection. Negative results do not preclude SARS-CoV-2 infection, do not rule out co-infections with other pathogens, and should not be used as the sole basis for treatment or other patient management decisions. Negative results must be combined with clinical observations, patient history, and epidemiological information. The expected result is Negative. Fact Sheet for Patients: SugarRoll.be Fact Sheet for Healthcare Providers: https://www.woods-mathews.com/ This test is not yet approved or cleared by the [place] FDA and  has been authorized for detection and/or diagnosis of SARS-CoV-2 by FDA under an Emergency Use Authorization (EUA). This EUA will remain  in effect (meaning this test can be used) for the duration of the COVID-19 declaration under Section 56 4(b)(1) of the Act, 21 U.S.C. section 360bbb-3(b)(1), unless the authorization is terminated or revoked sooner. Performed at [facility], [place] [address], [place], [place] [address]   MRSA PCR Screening     Status: None   Collection Time: 01/24/19 10:27 PM   Specimen: Nasopharyngeal  Result Value Ref Range Status   MRSA by PCR NEGATIVE NEGATIVE Final    Comment:        The GeneXpert MRSA Assay (FDA approved for NASAL specimens only), is  one component of a comprehensive MRSA colonization surveillance program. It is not intended to diagnose MRSA infection nor to guide or monitor treatment for MRSA infections. Performed at [facility], [address] [place], [place] [address]          Radiology Studies: No results found.      Scheduled Meds: . white petrolatum      . Chlorhexidine Gluconate Cloth  6 each Topical Q0600  . sodium chloride flush  3 mL Intravenous Q12H   Continuous Infusions: . sodium chloride 100 mL/hr at 01/27/19 0907  . sodium chloride    . sodium chloride Stopped (01/25/19 1039)   . sodium chloride Stopped (01/25/19 1038)  . heparin 1,850 Units/hr (01/27/19 1420)     LOS: 3 days   Time spent= 35 mins    Thomasenia Bottoms, MD [facility]  If 7PM-7AM, please contact night-coverage  01/27/2019, 3:30 PM

## 2019-01-27 NOTE — Progress Notes (Signed)
ANTICOAGULATION CONSULT NOTE   Pharmacy Consult for Heparin  Indication: pulmonary embolus  No Known Allergies  Patient Measurements: Height: 6\' 1"  (185.4 cm) Weight: 253 lb 15.5 oz (115.2 kg) IBW/kg (Calculated) : 79.9 Heparin Dosing Weight: 104.5 kg   Vital Signs: Temp: 98.1 F (36.7 C) (11/08 0755) Temp Source: Oral (11/08 0755) BP: 115/62 (11/08 0755) Pulse Rate: 74 (11/08 0755)  Labs: Recent Labs    01/24/19 1351  01/25/19 1553 01/26/19 0218 01/27/19 0331 01/27/19 0933  HGB  --    < > 15.2 14.9 15.3  --   HCT  --    < > 44.5 44.0 44.8  --   PLT  --    < > 126* 134* 164  --   APTT 21*  --   --   --   --   --   LABPROT 14.1  --   --   --   --   --   INR 1.1  --   --   --   --   --   HEPARINUNFRC  --    < > 0.39 0.35 <0.10* 0.32  CREATININE  --   --   --  1.07  --   --    < > = values in this interval not displayed.    Estimated Creatinine Clearance: 144 mL/min (by C-G formula based on SCr of 1.07 mg/dL).   Medical History: Past Medical History:  Diagnosis Date  . Marijuana use     Assessment: 23 yo male presented on 01/24/2019 for 3 episodes of syncope. Pharmacy consulted to dose heparin for pulmonary embolism. CTA on 01/24/2019 was positive for acute PE and CT evidence of right heart strain (RV/LV ratio = 1.3) consistent with at least submassive (intermediate risk) PE. CT head was negative for hemorrhage.   Patient s/p bilateral catheter directed alteplase turned off at [place] on 11/6. Heparin level at 0330 this morning was undetectable due to the patient accidentally pulling out his IV per nursing and heparin was resumed at approximately 0400. Heparin level at 1000 was therapeutic at 0.32 on heparin 1850 units/hr. CBC stable. No reported bleeding.   Goal of Therapy:  Heparin level 0.3-0.7 units/ml Monitor platelets by anticoagulation protocol: Yes   Plan:  - Continue heparin at 1850 units/hr - Monitor daily heparin level, CBC, and S/S of bleeding  - Follow  up plans to transition to oral anticoagulation  Cristela Felt, PharmD PGY1 Pharmacy Resident [employer]: [phone]

## 2019-01-28 LAB — CBC
HCT: 43.9 % (ref 39.0–52.0)
Hemoglobin: 14.7 g/dL (ref 13.0–17.0)
MCH: 30.4 pg (ref 26.0–34.0)
MCHC: 33.5 g/dL (ref 30.0–36.0)
MCV: 90.7 fL (ref 80.0–100.0)
Platelets: 181 10*3/uL (ref 150–400)
RBC: 4.84 MIL/uL (ref 4.22–5.81)
RDW: 13.1 % (ref 11.5–15.5)
WBC: 5.9 10*3/uL (ref 4.0–10.5)
nRBC: 0 % (ref 0.0–0.2)

## 2019-01-28 LAB — HEPARIN LEVEL (UNFRACTIONATED): Heparin Unfractionated: 0.42 IU/mL (ref 0.30–0.70)

## 2019-01-28 NOTE — Progress Notes (Signed)
PROGRESS NOTE    Craig Blankenship  UXN:[id number] DOB: Aug 02, 1995 DOA: 01/24/2019 PCP: Patient, No Pcp Per   Brief Narrative:  23 year old smoker with no known past medical history admitted to the hospital for syncopal episodes and hypoxia.  CTA revealed extensive bilateral pulmonary embolism with right-sided heart strain.  Also reported of headache, CT the head was negative.  Underwent catheter associated thrombolysis 11/5.  Echocardiogram-65-70% with reduced right RV function.  Lower extremity Dopplers was positive for acute DVT.   Assessment & Plan:   Principal Problem:   Pulmonary embolism (HCC) Active Problems:   DVT of left leg   Syncope and collapse   Hyperglycemia   AKI (acute kidney injury) (HCC)   Marijuana use  Extensive bilateral pulmonary embolism with cor pulmonale Status post catheter directed lysis 11/5 -Continue heparin drip. IR following  -Echocardiogram-65-70% with reduced RV function.  In the future can get limited echo if necessary. -Hypercoagulable work-up sent  -Lower extremity Dopplers-positive for DVT. -Supportive care. - CPK in normal limit -No claudication present  -Still unable to walk as he feels tightness and he still has significant swelling.  Increase ice bag application and minimal work with PT.  Discussed various types of anticoagulation including Coumadin, Eliquis and Xarelto.  Informed him about risks and benefit for each of them, he chooses eventually to be on Xarelto.   DVT prophylaxis: Heparin drip Code Status: Full code Family Communication:   Disposition Plan: Maintain hospital stay at least for 24-48 hours on a IV heparin and then transition him to oral anticoagulation.  Consultants:   Interventional radiology  Procedures:   EKOS-11/5  Subjective: Patient's left leg swelling somewhat and is improving as it is better than yesterday however there is significant swelling in the upper leg area still present. Denies any shortness  of breath and chest discomfort.  Review of Systems Otherwise negative except as per HPI, including: General: Denies fever, chills, night sweats or unintended weight loss. Resp: Denies cough, wheezing, shortness of breath. Cardiac: Denies chest pain, palpitations, orthopnea, paroxysmal nocturnal dyspnea. GI: Denies abdominal pain, nausea, vomiting, diarrhea or constipation GU: Denies dysuria, frequency, hesitancy or incontinence MS: Denies muscle aches, joint pain or swelling Neuro: Denies headache, neurologic deficits (focal weakness, numbness, tingling), abnormal gait Psych: Denies anxiety, depression, SI/HI/AVH Skin: Denies new rashes or lesions ID: Denies sick contacts, exotic exposures, travel  Objective: Vitals:   01/28/19 0020 01/28/19 0354 01/28/19 0837 01/28/19 1240  BP: (!) 116/55 117/65 114/62 113/67  Pulse: 87 70 71 69  Resp: 18 16 20 20   Temp: 98 F (36.7 C) 98.5 F (36.9 C) 98.4 F (36.9 C) 98.6 F (37 C)  TempSrc: Oral Oral Oral Oral  SpO2: 98% 97% 98% 98%  Weight:      Height:        Intake/Output Summary (Last 24 hours) at 01/28/2019 1457 Last data filed at 01/28/2019 13/11/2018 Gross per 24 hour  Intake 240 ml  Output 1500 ml  Net -1260 ml   Filed Weights   01/24/19 1300  Weight: 115.2 kg    Examination:  General exam: Appears calm and comfortable  Respiratory system: Clear to auscultation. Respiratory effort normal. Cardiovascular system: S1 & S2 heard, RRR. No JVD, murmurs, rubs, gallops or clicks. No pedal edema. Gastrointestinal system: Abdomen is nondistended, soft and nontender. No organomegaly or masses felt. Normal bowel sounds heard. Central nervous system: Alert and oriented. No focal neurological deficits. Extremities: Left lower extremity slightly more swollen than the right 1,  nonpitting edema. Skin: No rashes, lesions or ulcers Psychiatry: Judgement and insight appear normal. Mood & affect appropriate.     Data Reviewed:   CBC: Recent  Labs  Lab 01/25/19 0924 01/25/19 1553 01/26/19 0218 01/27/19 0331 01/28/19 0314  WBC 7.3 7.0 6.6 6.5 5.9  HGB 14.1 15.2 14.9 15.3 14.7  HCT 41.3 44.5 44.0 44.8 43.9  MCV 91.0 89.9 90.7 90.0 90.7  PLT 122* 126* 134* 164 009   Basic Metabolic Panel: Recent Labs  Lab 01/24/19 1023 01/26/19 0218  NA 137 138  K 4.4 4.3  CL 104 105  CO2 22 25  GLUCOSE 211* 96  BUN 12 8  CREATININE 1.25* 1.07  CALCIUM 9.2 9.0  MG  --  1.8  PHOS  --  3.6   GFR: Estimated Creatinine Clearance: 144 mL/min (by C-G formula based on SCr of 1.07 mg/dL). Liver Function Tests: No results for input(s): AST, ALT, ALKPHOS, BILITOT, PROT, ALBUMIN in the last 168 hours. No results for input(s): LIPASE, AMYLASE in the last 168 hours. No results for input(s): AMMONIA in the last 168 hours. Coagulation Profile: Recent Labs  Lab 01/24/19 1351  INR 1.1   Cardiac Enzymes: Recent Labs  Lab 01/27/19 1642  CKTOTAL 93   BNP (last 3 results) No results for input(s): PROBNP in the last 8760 hours. HbA1C: No results for input(s): HGBA1C in the last 72 hours. CBG: Recent Labs  Lab 01/24/19 1026 01/25/19 0815  GLUCAP 178* 90   Lipid Profile: No results for input(s): CHOL, HDL, LDLCALC, TRIG, CHOLHDL, LDLDIRECT in the last 72 hours. Thyroid Function Tests: No results for input(s): TSH, T4TOTAL, FREET4, T3FREE, THYROIDAB in the last 72 hours. Anemia Panel: No results for input(s): VITAMINB12, FOLATE, FERRITIN, TIBC, IRON, RETICCTPCT in the last 72 hours. Sepsis Labs: No results for input(s): PROCALCITON, LATICACIDVEN in the last 168 hours.  Recent Results (from the past 240 hour(s))  SARS CORONAVIRUS 2 (TAT 6-24 HRS) Nasopharyngeal Nasopharyngeal Swab     Status: None   Collection Time: 01/24/19  1:51 PM   Specimen: Nasopharyngeal Swab  Result Value Ref Range Status   SARS Coronavirus 2 NEGATIVE NEGATIVE Final    Comment: (NOTE) SARS-CoV-2 target nucleic acids are NOT DETECTED. The SARS-CoV-2 RNA  is generally detectable in upper and lower respiratory specimens during the acute phase of infection. Negative results do not preclude SARS-CoV-2 infection, do not rule out co-infections with other pathogens, and should not be used as the sole basis for treatment or other patient management decisions. Negative results must be combined with clinical observations, patient history, and epidemiological information. The expected result is Negative. Fact Sheet for Patients: SugarRoll.be Fact Sheet for Healthcare Providers: https://www.woods-mathews.com/ This test is not yet approved or cleared by the [place] FDA and  has been authorized for detection and/or diagnosis of SARS-CoV-2 by FDA under an Emergency Use Authorization (EUA). This EUA will remain  in effect (meaning this test can be used) for the duration of the COVID-19 declaration under Section 56 4(b)(1) of the Act, 21 U.S.C. section 360bbb-3(b)(1), unless the authorization is terminated or revoked sooner. Performed at [facility], [place] [address] [place], [place] [address]   MRSA PCR Screening     Status: None   Collection Time: 01/24/19 10:27 PM   Specimen: Nasopharyngeal  Result Value Ref Range Status   MRSA by PCR NEGATIVE NEGATIVE Final    Comment:        The GeneXpert MRSA Assay (FDA approved for  NASAL specimens only), is one component of a comprehensive MRSA colonization surveillance program. It is not intended to diagnose MRSA infection nor to guide or monitor treatment for MRSA infections. Performed at [facility], [address] [place], [place] [address]          Radiology Studies: No results found.      Scheduled Meds: . sodium chloride flush  3 mL Intravenous Q12H   Continuous Infusions: . sodium chloride    . sodium chloride Stopped (01/25/19 1039)  . sodium chloride Stopped (01/25/19 1038)  . heparin 1,850 Units/hr (01/28/19 0500)      LOS: 4 days   Time spent= 35 mins    Thomasenia Bottomsawfikul Dreanna Kyllo, MD [facility]  If 7PM-7AM, please contact night-coverage  01/28/2019, 2:57 PM

## 2019-01-28 NOTE — Progress Notes (Signed)
ANTICOAGULATION CONSULT NOTE   Pharmacy Consult for Heparin  Indication: pulmonary embolus  No Known Allergies  Patient Measurements: Height: 6\' 1"  (185.4 cm) Weight: 253 lb 15.5 oz (115.2 kg) IBW/kg (Calculated) : 79.9 Heparin Dosing Weight: 104.5 kg   Vital Signs: Temp: 98.4 F (36.9 C) (11/09 0837) Temp Source: Oral (11/09 0837) BP: 114/62 (11/09 0837) Pulse Rate: 71 (11/09 0837)  Labs: Recent Labs    01/26/19 0218 01/27/19 0331 01/27/19 0933 01/27/19 1642 01/28/19 0314  HGB 14.9 15.3  --   --  14.7  HCT 44.0 44.8  --   --  43.9  PLT 134* 164  --   --  181  HEPARINUNFRC 0.35 <0.10* 0.32  --  0.42  CREATININE 1.07  --   --   --   --   CKTOTAL  --   --   --  93  --     Estimated Creatinine Clearance: 144 mL/min (by C-G formula based on SCr of 1.07 mg/dL).   Medical History: Past Medical History:  Diagnosis Date  . Marijuana use     Assessment: 23 yo male presented on 01/24/2019 for 3 episodes of syncope. Pharmacy consulted to dose heparin for pulmonary embolism. CTA on 01/24/2019 was positive for acute PE and CT evidence of right heart strain (RV/LV ratio = 1.3) consistent with at least submassive (intermediate risk) PE. CT head was negative for hemorrhage.   Heparin level remains therapeutic this AM, CBC wnl and stable.    Goal of Therapy:  Heparin level 0.3-0.7 units/ml Monitor platelets by anticoagulation protocol: Yes   Plan:  Continue heparin at 1850 units/hr Daily heparin level, CBC, s/s bleeding F/u plan to transition to Xarelto today  Bertis Ruddy, PharmD Clinical Pharmacist Please check AMION for all [place] numbers 01/28/2019 9:21 AM

## 2019-01-29 LAB — PROTHROMBIN GENE MUTATION

## 2019-01-29 LAB — CBC
HCT: 43 % (ref 39.0–52.0)
Hemoglobin: 14.7 g/dL (ref 13.0–17.0)
MCH: 30.5 pg (ref 26.0–34.0)
MCHC: 34.2 g/dL (ref 30.0–36.0)
MCV: 89.2 fL (ref 80.0–100.0)
Platelets: 188 10*3/uL (ref 150–400)
RBC: 4.82 MIL/uL (ref 4.22–5.81)
RDW: 13 % (ref 11.5–15.5)
WBC: 6 10*3/uL (ref 4.0–10.5)
nRBC: 0 % (ref 0.0–0.2)

## 2019-01-29 LAB — HEPARIN LEVEL (UNFRACTIONATED): Heparin Unfractionated: 0.36 IU/mL (ref 0.30–0.70)

## 2019-01-29 LAB — FACTOR 5 LEIDEN

## 2019-01-29 MED ORDER — RIVAROXABAN 15 MG PO TABS
15.0000 mg | ORAL_TABLET | Freq: Two times a day (BID) | ORAL | Status: DC
  Administered 2019-01-29 – 2019-01-30 (×3): 15 mg via ORAL
  Filled 2019-01-29 (×3): qty 1

## 2019-01-29 MED ORDER — RIVAROXABAN 20 MG PO TABS: 20.0000 mg | ORAL_TABLET | Freq: Every day | ORAL | Status: DC

## 2019-01-29 NOTE — Progress Notes (Addendum)
ANTICOAGULATION CONSULT NOTE   Pharmacy Consult for Heparin  Indication: pulmonary embolus  No Known Allergies  Patient Measurements: Height: 6\' 1"  (185.4 cm) Weight: 253 lb 15.5 oz (115.2 kg) IBW/kg (Calculated) : 79.9 Heparin Dosing Weight: 104.5 kg   Vital Signs: Temp: 98.3 F (36.8 C) (11/10 0749) Temp Source: Oral (11/10 0749) BP: 110/60 (11/10 0749) Pulse Rate: 65 (11/10 0749)  Labs: Recent Labs    01/27/19 0331 01/27/19 0933 01/27/19 1642 01/28/19 0314 01/29/19 0300  HGB 15.3  --   --  14.7 14.7  HCT 44.8  --   --  43.9 43.0  PLT 164  --   --  181 188  HEPARINUNFRC <0.10* 0.32  --  0.42 0.36  CKTOTAL  --   --  93  --   --     Estimated Creatinine Clearance: 144 mL/min (by C-G formula based on SCr of 1.07 mg/dL).   Medical History: Past Medical History:  Diagnosis Date  . Marijuana use     Assessment: 74 yoM with no PMH other than tobacco abuse admitted with PE. Pt started on heparin and s/p EKOS 11/5. Dopplers positive for DVT, hypercoag w/u pending.  Heparin level remains therapeutic this morning, CBC wnl and stable.    Goal of Therapy:  Heparin level 0.3-0.7 units/ml Monitor platelets by anticoagulation protocol: Yes   Plan:  Continue heparin at 1850 units/hr Daily heparin level, CBC, s/s bleeding F/u plan to transition to Xarelto today   ADDENDUM: Pt to transition to Xarelto. Will place care management consult for copay assistance. Pharmacy to provide discharge education on Xarelto on 11/11.  Plan: -Xarelto 15mg  BID x21 days then 20mg  qSupper starting 12/1 -Stop IV heparin   Arrie Senate, PharmD, BCPS Clinical Pharmacist [phone] Please check AMION for all [place] numbers 01/29/2019

## 2019-01-29 NOTE — Progress Notes (Signed)
PROGRESS NOTE    Craig Blankenship  WUJ:[id number] DOB: [birth year] DOA: 01/24/2019 PCP: Patient, No Pcp Per   Brief Narrative:  23 year old smoker with no known past medical history admitted to the hospital for syncopal episodes and hypoxia.  CTA revealed extensive bilateral pulmonary embolism with right-sided heart strain.  Also reported of headache, CT the head was negative.  Underwent catheter associated thrombolysis 11/5.  Echocardiogram-65-70% with reduced right RV function.  Lower extremity Dopplers was positive for acute DVT.   Assessment & Plan:   Principal Problem:   Pulmonary embolism (HCC) Active Problems:   DVT of left leg   Syncope and collapse   Hyperglycemia   AKI (acute kidney injury) (HCC)   Marijuana use  Extensive bilateral pulmonary embolism with cor pulmonale Status post catheter directed lysis 11/5 -Continue heparin drip. IR following  -Echocardiogram-65-70% with reduced RV function.  In the future can get limited echo if necessary. -Hypercoagulable work-up sent; still pending -Lower extremity Dopplers-positive for DVT. -Supportive care. - CPK in normal limit -No claudication present  -Still unable to walk as he feels tightness and he still has significant swelling.  Increase ice bag application and minimal work with PT. -Has been on heparin drip.  Pharmacy consulted to convert to Xarelto. Patient may be discharged home tomorrow  Discussed various types of anticoagulation including Coumadin, Eliquis and Xarelto.  Informed him about risks and benefit for each of them, he chooses eventually to be on Xarelto.   DVT prophylaxis: Heparin drip Code Status: Full code Family Communication:   Disposition Plan: Maintain hospital stay at least for 24-48 hours on a IV heparin and then transition him to oral anticoagulation.  Consultants:   Interventional radiology  Procedures:   EKOS-11/5  Subjective: Patient is improving as it is better than yesterday  however there is some swelling in the upper leg area still present.  Face is more difficulty when he tries to walk.  Still is willing to do minimal or intermittent walking.  Denies any shortness of breath and chest discomfort.  Review of Systems Otherwise negative except as per HPI, including: General: Denies fever, chills, night sweats or unintended weight loss. Resp: Denies cough, wheezing, shortness of breath. Cardiac: Denies chest pain, palpitations, orthopnea, paroxysmal nocturnal dyspnea. GI: Denies abdominal pain, nausea, vomiting, diarrhea or constipation GU: Denies dysuria, frequency, hesitancy or incontinence MS: Denies muscle aches, joint pain or swelling Neuro: Denies headache, neurologic deficits (focal weakness, numbness, tingling), abnormal gait Psych: Denies anxiety, depression, SI/HI/AVH Skin: Denies new rashes or lesions ID: Denies sick contacts, exotic exposures, travel  Objective: Vitals:   01/28/19 2300 01/29/19 0015 01/29/19 0433 01/29/19 0749  BP:  120/65 (!) 106/50 110/60  Pulse:  68 78 65  Resp: (!) 26 20 (!) 21 (!) 22  Temp:  98.3 F (36.8 C) 98.5 F (36.9 C) 98.3 F (36.8 C)  TempSrc:  Oral Oral Oral  SpO2:   98%   Weight:      Height:        Intake/Output Summary (Last 24 hours) at 01/29/2019 1501 Last data filed at 01/29/2019 1300 Gross per 24 hour  Intake 590 ml  Output 1375 ml  Net -785 ml   Filed Weights   01/24/19 1300  Weight: 115.2 kg    Examination:  General exam: Appears calm and comfortable  Respiratory system: Clear to auscultation. Respiratory effort normal. Cardiovascular system: S1 & S2 heard, RRR. No JVD, murmurs, rubs, gallops or clicks. No pedal edema. Gastrointestinal system: Abdomen  is nondistended, soft and nontender. No organomegaly or masses felt. Normal bowel sounds heard. Central nervous system: Alert and oriented. No focal neurological deficits. Extremities: Left lower extremity slightly more swollen than the right  1, nonpitting edema. Skin: No rashes, lesions or ulcers Psychiatry: Judgement and insight appear normal. Mood & affect appropriate.     Data Reviewed:   CBC: Recent Labs  Lab 01/25/19 1553 01/26/19 0218 01/27/19 0331 01/28/19 0314 01/29/19 0300  WBC 7.0 6.6 6.5 5.9 6.0  HGB 15.2 14.9 15.3 14.7 14.7  HCT 44.5 44.0 44.8 43.9 43.0  MCV 89.9 90.7 90.0 90.7 89.2  PLT 126* 134* 164 181 702   Basic Metabolic Panel: Recent Labs  Lab 01/24/19 1023 01/26/19 0218  NA 137 138  K 4.4 4.3  CL 104 105  CO2 22 25  GLUCOSE 211* 96  BUN 12 8  CREATININE 1.25* 1.07  CALCIUM 9.2 9.0  MG  --  1.8  PHOS  --  3.6   GFR: Estimated Creatinine Clearance: 144 mL/min (by C-G formula based on SCr of 1.07 mg/dL). Liver Function Tests: No results for input(s): AST, ALT, ALKPHOS, BILITOT, PROT, ALBUMIN in the last 168 hours. No results for input(s): LIPASE, AMYLASE in the last 168 hours. No results for input(s): AMMONIA in the last 168 hours. Coagulation Profile: Recent Labs  Lab 01/24/19 1351  INR 1.1   Cardiac Enzymes: Recent Labs  Lab 01/27/19 1642  CKTOTAL 93   BNP (last 3 results) No results for input(s): PROBNP in the last 8760 hours. HbA1C: No results for input(s): HGBA1C in the last 72 hours. CBG: Recent Labs  Lab 01/24/19 1026 01/25/19 0815  GLUCAP 178* 90   Lipid Profile: No results for input(s): CHOL, HDL, LDLCALC, TRIG, CHOLHDL, LDLDIRECT in the last 72 hours. Thyroid Function Tests: No results for input(s): TSH, T4TOTAL, FREET4, T3FREE, THYROIDAB in the last 72 hours. Anemia Panel: No results for input(s): VITAMINB12, FOLATE, FERRITIN, TIBC, IRON, RETICCTPCT in the last 72 hours. Sepsis Labs: No results for input(s): PROCALCITON, LATICACIDVEN in the last 168 hours.  Recent Results (from the past 240 hour(s))  SARS CORONAVIRUS 2 (TAT 6-24 HRS) Nasopharyngeal Nasopharyngeal Swab     Status: None   Collection Time: 01/24/19  1:51 PM   Specimen: Nasopharyngeal  Swab  Result Value Ref Range Status   SARS Coronavirus 2 NEGATIVE NEGATIVE Final    Comment: (NOTE) SARS-CoV-2 target nucleic acids are NOT DETECTED. The SARS-CoV-2 RNA is generally detectable in upper and lower respiratory specimens during the acute phase of infection. Negative results do not preclude SARS-CoV-2 infection, do not rule out co-infections with other pathogens, and should not be used as the sole basis for treatment or other patient management decisions. Negative results must be combined with clinical observations, patient history, and epidemiological information. The expected result is Negative. Fact Sheet for Patients: SugarRoll.be Fact Sheet for Healthcare Providers: https://www.woods-mathews.com/ This test is not yet approved or cleared by the [place] FDA and  has been authorized for detection and/or diagnosis of SARS-CoV-2 by FDA under an Emergency Use Authorization (EUA). This EUA will remain  in effect (meaning this test can be used) for the duration of the COVID-19 declaration under Section 56 4(b)(1) of the Act, 21 U.S.C. section 360bbb-3(b)(1), unless the authorization is terminated or revoked sooner. Performed at [facility], [place] [address], [place], [place] [address]   MRSA PCR Screening     Status: None   Collection Time: 01/24/19 10:27 PM  Specimen: Nasopharyngeal  Result Value Ref Range Status   MRSA by PCR NEGATIVE NEGATIVE Final    Comment:        The GeneXpert MRSA Assay (FDA approved for NASAL specimens only), is one component of a comprehensive MRSA colonization surveillance program. It is not intended to diagnose MRSA infection nor to guide or monitor treatment for MRSA infections. Performed at [facility], [address] [place], [place] [address]          Radiology Studies: No results found.      Scheduled Meds: . sodium chloride flush  3 mL Intravenous Q12H    Continuous Infusions: . sodium chloride    . sodium chloride Stopped (01/25/19 1039)  . sodium chloride Stopped (01/25/19 1038)  . heparin 1,850 Units/hr (01/28/19 2051)     LOS: 5 days   Time spent= 35 mins    Thomasenia Bottoms, MD [facility]  If 7PM-7AM, please contact night-coverage  01/29/2019, 3:01 PM

## 2019-01-29 NOTE — Progress Notes (Signed)
Transitions of Care Pharmacist Note  Craig Blankenship is a 23 y.o. male that has been diagnosed with PE and will be prescribed Xarelto (rivaroxaban) at discharge.   Patient Education: I provided the following education on 01/29/2019 to the patient: How to take the medication Described what the medication is Signs of bleeding Signs/symptoms of VTE and stroke  Answered their questions  Discharge Medications Plan: The patient wants to have their discharge medications filled by the Transitions of Care pharmacy rather than their usual pharmacy.  The discharge orders pharmacy has been changed to the Transitions of Care pharmacy, the patient will receive a phone call regarding co-pay, and their medications will be delivered by the Transitions of Care pharmacy.     Thank you for allowing pharmacy to participate in this patient's care.  Stasia Somero L. Devin Going, [place] PGY1 Pharmacy Resident [phone] 01/29/19      6:03 PM  Please check AMION for all [place] phone numbers After 10:00 PM, call the [facility] [phone]

## 2019-01-29 NOTE — TOC Initial Note (Signed)
Transition of Care (TOC) - Initial/Assessment Note  Donn Pierini RN, BSN Transitions of Care Unit 4E- RN Case Manager [phone]   Patient Details  Name: Craig Blankenship MRN: [id number] Date of Birth: [birth year]  Transition [facility]) CM/SW Contact:    Darrold Span, RN Phone Number: 01/29/2019, 3:27 PM  Clinical Narrative:                 Pt admitted with PE/DVT, plan for Xarelto- CM spoke with pt at bedside- per pt he does not currently have insurance- and no PCP- discussed setting up primary care with community clinic- pt agreeable- CM will call [facility] to set up f/u appointment prior to discharge- also discussed cost of Xarelto without insurance which out of pocket is around $400/mo- pt could use 30 day free card for first month- and would need to apply for pt assistance. CM will give pt application for assistance prior to discharge to f/u with clinic. - Can use TOC to fill meds prior to discharge.  Expected Discharge Plan: Home/Self Care Barriers to Discharge: Continued Medical Work up   Patient Goals and CMS Choice Patient states their goals for this hospitalization and ongoing recovery are:: home CMS Medicare.gov Compare Post Acute Care list provided to:: Patient Choice offered to / list presented to : Patient, NA  Expected Discharge Plan and Services Expected Discharge Plan: Home/Self Care   Discharge Planning Services: CM Consult   Living arrangements for the past 2 months: Single Family Home                 DME Arranged: N/A DME Agency: NA       HH Arranged: NA HH Agency: NA        Prior Living Arrangements/Services Living arrangements for the past 2 months: Single Family Home Lives with:: Parents Patient language and need for interpreter reviewed:: Yes Do you feel safe going back to the place where you live?: Yes      Need for Family Participation in Patient Care: Yes (Comment) Care giver support system in place?: Yes (comment)   Criminal  Activity/Legal Involvement Pertinent to Current Situation/Hospitalization: No - Comment as needed  Activities of Daily Living Home Assistive Devices/Equipment: None ADL Screening (condition at time of admission) Patient's cognitive ability adequate to safely complete daily activities?: Yes Is the patient deaf or have difficulty hearing?: No Does the patient have difficulty seeing, even when wearing glasses/contacts?: No Does the patient have difficulty concentrating, remembering, or making decisions?: No Patient able to express need for assistance with ADLs?: No Does the patient have difficulty dressing or bathing?: No Independently performs ADLs?: Yes (appropriate for developmental age) Does the patient have difficulty walking or climbing stairs?: No Weakness of Legs: None Weakness of Arms/Hands: None  Permission Sought/Granted                  Emotional Assessment Appearance:: Appears stated age Attitude/Demeanor/Rapport: Engaged Affect (typically observed): Appropriate, Pleasant Orientation: : Oriented to Self, Oriented to Place, Oriented to  Time, Oriented to Situation Alcohol / Substance Use: Illicit Drugs Psych Involvement: No (comment)  Admission diagnosis:  PE (pulmonary thromboembolism) (HCC) [I26.99] Other acute pulmonary embolism with acute cor pulmonale (HCC) [I26.09] Pulmonary embolism (HCC) [I26.99] Patient Active Problem List   Diagnosis Date Noted  . Pulmonary embolism (HCC) 01/24/2019  . DVT of left leg 01/24/2019  . Syncope and collapse 01/24/2019  . Hyperglycemia 01/24/2019  . AKI (acute kidney injury) (HCC) 01/24/2019  . Marijuana use  01/24/2019   PCP:  Patient, No Pcp Per Pharmacy:   [id number] - [place], [place] [address] [place] [address] Phone: [phone] Fax: [phone]     Social Determinants of Health (SDOH) Interventions    Readmission Risk  Interventions No flowsheet data found.

## 2019-01-29 NOTE — Discharge Instructions (Signed)
Information on my medicine - XARELTO (rivaroxaban)  This medication education was reviewed with me or my healthcare representative as part of my discharge preparation.  The pharmacist that spoke with me during my hospital stay was:  Calieb Lichtman Amada Kingfisher, [place]? Xarelto was prescribed to treat blood clots that may have been found in the veins of your legs (deep vein thrombosis) or in your lungs (pulmonary embolism) and to reduce the risk of them occurring again.  What do you need to know about Xarelto? The starting dose is one 15 mg tablet taken TWICE daily with food for the FIRST 21 DAYS then on February 19, 2019 the dose is changed to one 20 mg tablet taken ONCE A DAY with your evening meal.  DO NOT stop taking Xarelto without talking to the health care provider who prescribed the medication.  Refill your prescription for 20 mg tablets before you run out.  After discharge, you should have regular check-up appointments with your healthcare provider that is prescribing your Xarelto.  In the future your dose may need to be changed if your kidney function changes by a significant amount.  What do you do if you miss a dose? If you are taking Xarelto TWICE DAILY and you miss a dose, take it as soon as you remember. You may take two 15 mg tablets (total 30 mg) at the same time then resume your regularly scheduled 15 mg twice daily the next day.  If you are taking Xarelto ONCE DAILY and you miss a dose, take it as soon as you remember on the same day then continue your regularly scheduled once daily regimen the next day. Do not take two doses of Xarelto at the same time.   Important Safety Information Xarelto is a blood thinner medicine that can cause bleeding. You should call your healthcare provider right away if you experience any of the following: ? Bleeding from an injury or your nose that does not stop. ? Unusual colored urine (red or dark brown) or unusual  colored stools (red or black). ? Unusual bruising for unknown reasons. ? A serious fall or if you hit your head (even if there is no bleeding).  Some medicines may interact with Xarelto and might increase your risk of bleeding while on Xarelto. To help avoid this, consult your healthcare provider or pharmacist prior to using any new prescription or non-prescription medications, including herbals, vitamins, non-steroidal anti-inflammatory drugs (NSAIDs) and supplements.  This website has more information on Xarelto: [email or web address]-benson.[email or web address]

## 2019-01-30 LAB — CBC
HCT: 45.6 % (ref 39.0–52.0)
Hemoglobin: 15.7 g/dL (ref 13.0–17.0)
MCH: 30.8 pg (ref 26.0–34.0)
MCHC: 34.4 g/dL (ref 30.0–36.0)
MCV: 89.6 fL (ref 80.0–100.0)
Platelets: 207 10*3/uL (ref 150–400)
RBC: 5.09 MIL/uL (ref 4.22–5.81)
RDW: 13.2 % (ref 11.5–15.5)
WBC: 5.8 10*3/uL (ref 4.0–10.5)
nRBC: 0.3 % — ABNORMAL HIGH (ref 0.0–0.2)

## 2019-01-30 MED ORDER — RIVAROXABAN 15 MG PO TABS: 15.0000 mg | ORAL_TABLET | Freq: Two times a day (BID) | ORAL | 0 refills | Status: DC

## 2019-01-30 MED ORDER — RIVAROXABAN (XARELTO) VTE STARTER PACK (15 & 20 MG): ORAL_TABLET | ORAL | 0 refills | Status: DC

## 2019-01-30 MED ORDER — RIVAROXABAN 20 MG PO TABS: 20.0000 mg | ORAL_TABLET | Freq: Every day | ORAL | 0 refills | Status: DC

## 2019-01-30 MED ORDER — ACETAMINOPHEN 325 MG PO TABS: 650.0000 mg | ORAL_TABLET | Freq: Four times a day (QID) | ORAL | 0 refills | Status: AC | PRN

## 2019-01-30 MED ORDER — ONDANSETRON HCL 4 MG PO TABS: 4.0000 mg | ORAL_TABLET | Freq: Four times a day (QID) | ORAL | 0 refills | Status: AC | PRN

## 2019-01-30 MED FILL — XARELTO STARTER PACK: 15 & 20 | 30 days supply | Qty: 51 | Fill #0

## 2019-01-30 MED FILL — ACETAMINOPHEN 325 MG TABS: 325 | 4 days supply | Qty: 30 | Fill #0

## 2019-01-30 MED FILL — ONDANSETRON HCL 4 MG TABLET: 4 | 5 days supply | Qty: 20 | Fill #0

## 2019-01-30 NOTE — Discharge Summary (Signed)
Physician Discharge Summary  Patient ID: Craig Blankenship MRN: [id number] DOB/AGE: [birth year] 22 y.o.  Admit date: 01/24/2019 Discharge date: 01/30/2019  Admission Diagnoses:  Syncope and Hypoxia   Discharge Diagnoses:  Principal Problem:   Pulmonary embolism (HCC) Active Problems:   DVT of left leg   Syncope and collapse   Hyperglycemia   AKI (acute kidney injury) (HCC)   Marijuana use   Discharged Condition: fair  Hospital Course: 24 year old smoker with no known past medical history admitted to the hospital for syncopal episodes and hypoxia.  CTA revealed extensive bilateral pulmonary embolism with right-sided heart strain.  Also reported of headache, CT the head was negative.  Underwent catheter associated thrombolysis 11/5.  Echocardiogram-65-70% with reduced right RV function.  Lower extremity Dopplers was positive for acute DVT.  Extensive bilateral pulmonary embolism with cor pulmonale Status post catheter directed lysis 11/5 - S/P Bilateral PE catheter directed thrombolysis by IR  - Started and continued heparin drip  - -Lower extremity Dopplers-positive for DVT (left leg)  - Changed to Xarelto as directed; Rx given for 30 days  - Must f/u PCP for refill  -Echocardiogram-65-70% with reduced RV function.  -  In the future can get limited echo if necessary per PCP  -Hypercoagulable work-up sent; still pending - CPK in normal limit -No claudication present  -Started walking and worked with PT; he feels slight tightness and he still has mod swelling.  Increase ice bag application only as tolerated walk. Pt may return to light work for 2 weeks. Work note given.   - Important to f/u with PCP in 1-2 weeks    Discussed various types of anticoagulation including Coumadin, Eliquis and Xarelto.  Informed him about risks and benefit for each of them, he chooses eventually to be on Xarelto.  - CM assistance provided with resources   Consults: IR  Significant Diagnostic  Studies: CT-angio, [place]  Treatments: Heparin; tPA infusion, Xarelto   Discharge Exam: Blood pressure 124/71, pulse 66, temperature 98.1 F (36.7 C), temperature source Oral, resp. rate (!) 22, height 6\' 1"  (1.854 m), weight 115.2 kg, SpO2 98 %.  General exam: Appears calm and comfortable  Respiratory system: Clear to auscultation. Respiratory effort normal. Cardiovascular system: S1 & S2 heard, RRR. No JVD, murmurs, rubs, gallops or clicks. No pedal edema. Gastrointestinal system: Abdomen is nondistended, soft and nontender. No organomegaly or masses felt. Normal bowel sounds heard. Central nervous system: Alert and oriented. No focal neurological deficits. Extremities: Left lower extremity slightly more swollen than the right 1, nonpitting edema. Skin: No rashes, lesions or ulcers Psychiatry: Judgement and insight appear normal. Mood & affect appropriate.   Disposition: Discharge disposition: 01-Home or Self Care       Discharge Instructions    Call MD for:  redness, tenderness, or signs of infection (pain, swelling, redness, odor or green/yellow discharge around incision site)   Complete by: As directed    Diet - low sodium heart healthy   Complete by: As directed    Discharge instructions   Complete by: As directed    DVT and PE   Increase activity slowly   Complete by: As directed    Avoid exertional activity and heavy lifting for 1-2 weeks from now     Allergies as of 01/30/2019   No Known Allergies     Medication List    TAKE these medications   acetaminophen 325 MG tablet Commonly known as: TYLENOL Take 2 tablets (650 mg total) by mouth every  6 (six) hours as needed for mild pain (or Fever >/= 101).   ondansetron 4 MG tablet Commonly known as: ZOFRAN Take 1 tablet (4 mg total) by mouth every 6 (six) hours as needed for nausea.   Rivaroxaban 15 MG Tabs tablet Commonly known as: XARELTO Take 1 tablet (15 mg total) by mouth 2 (two) times daily.   rivaroxaban  20 MG Tabs tablet Commonly known as: XARELTO Take 1 tablet (20 mg total) by mouth daily with supper. Start taking on: February 19, 2019      [place]. Go on 02/25/2019.   Why: follow up appointment and to establish care - appointment at 9:30 am- please arrive 15 min early with photo ID and medication list.  Contact information: [place] [phone]          Signed: Thornell Mule 01/30/2019, 12:14 PM

## 2019-01-30 NOTE — TOC Transition Note (Signed)
Transition of Care [facility]) - CM/SW Discharge Note Marvetta Gibbons RN, BSN Transitions of Care Unit 4E- RN Case Manager [phone]   Patient Details  Name: Craig Blankenship MRN: [id number] Date of Birth: [birth year]  Transition of Care [facility]) CM/SW Contact:  Dawayne Patricia, RN Phone Number: 01/30/2019, 4:41 PM   Clinical Narrative:    F/u done with pt- appointment made at the [facility] for Dec. 7 at 930- info provided to pt along with application for Xarelto assistance- TOC pharmacy to provide 30 day free for Xarelto along with other d/c meds prior to discharge- pt states he has no further questions or needs.    Final next level of care: Home/Self Care Barriers to Discharge: Barriers Resolved   Patient Goals and CMS Choice Patient states their goals for this hospitalization and ongoing recovery are:: home CMS Medicare.gov Compare Post Acute Care list provided to:: Patient Choice offered to / list presented to : Patient, NA  Discharge Placement                Home/ self care        Discharge Plan and Services   Discharge Planning Services: CM Consult            DME Arranged: N/A DME Agency: NA       HH Arranged: NA HH Agency: NA        Social Determinants of Health (SDOH) Interventions     Readmission Risk Interventions No flowsheet data found.

## 2019-02-25 ENCOUNTER — Encounter (INDEPENDENT_AMBULATORY_CARE_PROVIDER_SITE_OTHER): Payer: Self-pay | Admitting: Primary Care

## 2019-02-25 ENCOUNTER — Other Ambulatory Visit: Payer: Self-pay

## 2019-02-25 ENCOUNTER — Ambulatory Visit (INDEPENDENT_AMBULATORY_CARE_PROVIDER_SITE_OTHER): Payer: Self-pay | Admitting: Primary Care

## 2019-02-25 VITALS — BP 128/59 | HR 74 | Temp 97.3°F | Ht 73.0 in | Wt 258.6 lb

## 2019-02-25 DIAGNOSIS — Z09 Encounter for follow-up examination after completed treatment for conditions other than malignant neoplasm: Secondary | ICD-10-CM

## 2019-02-25 DIAGNOSIS — I2699 Other pulmonary embolism without acute cor pulmonale: Secondary | ICD-10-CM

## 2019-02-25 DIAGNOSIS — Z7689 Persons encountering health services in other specified circumstances: Secondary | ICD-10-CM

## 2019-02-25 DIAGNOSIS — I82412 Acute embolism and thrombosis of left femoral vein: Secondary | ICD-10-CM

## 2019-02-25 MED ORDER — RIVAROXABAN 20 MG PO TABS: 20.0000 mg | ORAL_TABLET | Freq: Every day | ORAL | 6 refills | Status: DC

## 2019-02-25 MED FILL — XARELTO 20 MG TABLET: 20 | 30 days supply | Qty: 30 | Fill #0

## 2019-02-25 NOTE — Patient Instructions (Signed)
Pulmonary Embolism ° °A pulmonary embolism (PE) is a sudden blockage or decrease of blood flow in one or both lungs. Most blockages come from a blood clot that forms in the vein of a lower leg, thigh, or arm (deep vein thrombosis, DVT) and travels to the lungs. A clot is blood that has thickened into a gel or solid. PE is a dangerous and life-threatening condition that needs to be treated right away. °What are the causes? °This condition is usually caused by a blood clot that forms in a vein and moves to the lungs. In rare cases, it may be caused by air, fat, part of a tumor, or other tissue that moves through the veins and into the lungs. °What increases the risk? °The following factors may make you more likely to develop this condition: °· Experiencing a traumatic injury, such as breaking a hip or leg. °· Having: °? A spinal cord injury. °? Orthopedic surgery, especially hip or knee replacement. °? Any major surgery. °? A stroke. °? DVT. °? Blood clots or blood clotting disease. °? Long-term (chronic) lung or heart disease. °? Cancer treated with chemotherapy. °? A central venous catheter. °· Taking medicines that contain estrogen. These include birth control pills and hormone replacement therapy. °· Being: °? Pregnant. °? In the period of time after your baby is delivered (postpartum). °? Older than age 60. °? Overweight. °? A smoker, especially if you have other risks. °What are the signs or symptoms? °Symptoms of this condition usually start suddenly and include: °· Shortness of breath during activity or at rest. °· Coughing, coughing up blood, or coughing up blood-tinged mucus. °· Chest pain that is often worse with deep breaths. °· Rapid or irregular heartbeat. °· Feeling light-headed or dizzy. °· Fainting. °· Feeling anxious. °· Fever. °· Sweating. °· Pain and swelling in a leg. This is a symptom of DVT, which can lead to PE. °How is this diagnosed? °This condition may be diagnosed based on: °· Your medical  history. °· A physical exam. °· Blood tests. °· CT pulmonary angiogram. This test checks blood flow in and around your lungs. °· Ventilation-perfusion scan, also called a lung VQ scan. This test measures air flow and blood flow to the lungs. °· An ultrasound of the legs. °How is this treated? °Treatment for this condition depends on many factors, such as the cause of your PE, your risk for bleeding or developing more clots, and other medical conditions you have. Treatment aims to remove, dissolve, or stop blood clots from forming or growing larger. Treatment may include: °· Medicines, such as: °? Blood thinning medicines (anticoagulants) to stop clots from forming. °? Medicines that dissolve clots (thrombolytics). °· Procedures, such as: °? Using a flexible tube to remove a blood clot (embolectomy) or to deliver medicine to destroy it (catheter-directed thrombolysis). °? Inserting a filter into a large vein that carries blood to the heart (inferior vena cava). This filter (vena cava filter) catches blood clots before they reach the lungs. °? Surgery to remove the clot (surgical embolectomy). This is rare. °You may need a combination of immediate, long-term (up to 3 months after diagnosis), and extended (more than 3 months after diagnosis) treatments. Your treatment may continue for several months (maintenance therapy). You and your health care provider will work together to choose the treatment program that is best for you. °Follow these instructions at home: °Medicines °· Take over-the-counter and prescription medicines only as told by your health care provider. °· If you   are taking an anticoagulant medicine: °? Take the medicine every day at the same time each day. °? Understand what foods and drugs interact with your medicine. °? Understand the side effects of this medicine, including excessive bruising or bleeding. Ask your health care provider or pharmacist about other side effects. °General  instructions °· Wear a medical alert bracelet or carry a medical alert card that says you have had a PE and lists what medicines you take. °· Ask your health care provider when you may return to your normal activities. Avoid sitting or lying for a long time without moving. °· Maintain a healthy weight. Ask your health care provider what weight is healthy for you. °· Do not use any products that contain nicotine or tobacco, such as cigarettes, e-cigarettes, and chewing tobacco. If you need help quitting, ask your health care provider. °· Talk with your health care provider about any travel plans. It is important to make sure that you are still able to take your medicine while on trips. °· Keep all follow-up visits as told by your health care provider. This is important. °Contact a health care provider if: °· You missed a dose of your blood thinner medicine. °Get help right away if: °· You have: °? New or increased pain, swelling, warmth, or redness in an arm or leg. °? Numbness or tingling in an arm or leg. °? Shortness of breath during activity or at rest. °? A fever. °? Chest pain. °? A rapid or irregular heartbeat. °? A severe headache. °? Vision changes. °? A serious fall or accident, or you hit your head. °? Stomach (abdominal) pain. °? Blood in your vomit, stool, or urine. °? A cut that will not stop bleeding. °· You cough up blood. °· You feel light-headed or dizzy. °· You cannot move your arms or legs. °· You are confused or have memory loss. °These symptoms may represent a serious problem that is an emergency. Do not wait to see if the symptoms will go away. Get medical help right away. Call your local emergency services (911 in the [place] Do not drive yourself to the hospital. °Summary °· A pulmonary embolism (PE) is a sudden blockage or decrease of blood flow in one or both lungs. PE is a dangerous and life-threatening condition that needs to be treated right away. °· Treatments for this condition usually  include medicines to thin your blood (anticoagulants) or medicines to break apart blood clots (thrombolytics). °· If you are given blood thinners, it is important to take the medicine every day at the same time each day. °· Understand what foods and drugs interact with any medicines that you are taking. °· If you have signs of PE or DVT, call your local emergency services (911 in the [place] °This information is not intended to replace advice given to you by your health care provider. Make sure you discuss any questions you have with your health care provider. °Document Released: 03/04/2000 Document Revised: 12/13/2017 Document Reviewed: 12/13/2017 °Elsevier Patient Education © 2020 [employer] ° °Deep Vein Thrombosis ° °Deep vein thrombosis (DVT) is a condition in which a blood clot forms in a deep vein, such as a lower leg, thigh, or arm vein. A clot is blood that has thickened into a gel or solid. This condition is dangerous. It can lead to serious and even life-threatening complications if the clot travels to the lungs and causes a blockage (pulmonary embolism). It can also damage veins in the leg. This   can result in leg pain, swelling, discoloration, and sores (post-thrombotic syndrome). °What are the causes? °This condition may be caused by: °· A slowdown of blood flow. °· Damage to a vein. °· A condition that causes blood to clot more easily, such as an inherited clotting disorder. °What increases the risk? °The following factors may make you more likely to develop this condition: °· Being overweight. °· Being older, especially over age 60. °· Sitting or lying down for more than four hours. °· Being in the hospital. °· Lack of physical activity (sedentary lifestyle). °· Pregnancy, being in childbirth, or having recently given birth. °· Taking medicines that contain estrogen, such as medicines to prevent pregnancy. °· Smoking. °· A history of any of the following: °? Blood clots or a blood clotting  disease. °? Peripheral vascular disease. °? Inflammatory bowel disease. °? Cancer. °? Heart disease. °? Genetic conditions that affect how your blood clots, such as Factor V Leiden mutation. °? Neurological diseases that affect your legs (leg paresis). °? A recent injury, such as a car accident. °? Major or lengthy surgery. °? A central line placed inside a large vein. °What are the signs or symptoms? °Symptoms of this condition include: °· Swelling, pain, or tenderness in an arm or leg. °· Warmth, redness, or discoloration in an arm or leg. °If the clot is in your leg, symptoms may be more noticeable or worse when you stand or walk. Some people may not develop any symptoms. °How is this diagnosed? °This condition is diagnosed with: °· A medical history and physical exam. °· Tests, such as: °? Blood tests. These are done to check how well your blood clots. °? Ultrasound. This is done to check for clots. °? Venogram. For this test, contrast dye is injected into a vein and X-rays are taken to check for any clots. °How is this treated? °Treatment for this condition depends on: °· The cause of your DVT. °· Your risk for bleeding or developing more clots. °· Any other medical conditions that you have. °Treatment may include: °· Taking a blood thinner (anticoagulant). This type of medicine prevents clots from forming. It may be taken by mouth, injected under the skin, or injected through an IV (catheter). °· Injecting clot-dissolving medicines into the affected vein (catheter-directed thrombolysis). °· Having surgery. Surgery may be done to: °? Remove the clot. °? Place a filter in a large vein to catch blood clots before they reach the lungs. °Some treatments may be continued for up to six months. °Follow these instructions at home: °If you are taking blood thinners: °· Take the medicine exactly as told by your health care provider. Some blood thinners need to be taken at the same time every day. Do not skip a  dose. °· Talk with your health care provider before you take any medicines that contain aspirin or NSAIDs. These medicines increase your risk for dangerous bleeding. °· Ask your health care provider about foods and drugs that could change the way the medicine works (may interact). Avoid those things if your health care provider tells you to do so. °· Blood thinners can cause easy bruising and may make it difficult to stop bleeding. Because of this: °? Be very careful when using knives, scissors, or other sharp objects. °? Use an electric razor instead of a blade. °? Avoid activities that could cause injury or bruising, and follow instructions about how to prevent falls. °· Wear a medical alert bracelet or carry a card that lists   what medicines you take. °General instructions °· Take over-the-counter and prescription medicines only as told by your health care provider. °· Return to your normal activities as told by your health care provider. Ask your health care provider what activities are safe for you. °· Wear compression stockings if recommended by your health care provider. °· Keep all follow-up visits as told by your health care provider. This is important. °How is this prevented? °To lower your risk of developing this condition again: °· For 30 or more minutes every day, do an activity that: °? Involves moving your arms and legs. °? Increases your heart rate. °· When traveling for longer than four hours: °? Exercise your arms and legs every hour. °? Drink plenty of water. °? Avoid drinking alcohol. °· Avoid sitting or lying for a long time without moving your legs. °· If you have surgery or you are hospitalized, ask about ways to prevent blood clots. These may include taking frequent walks or using anticoagulants. °· Stay at a healthy weight. °· If you are a woman who is older than age 35, avoid unnecessary use of medicines that contain estrogen, such as some birth control pills. °· Do not use any products that  contain nicotine or tobacco, such as cigarettes and e-cigarettes. This is especially important if you take estrogen medicines. If you need help quitting, ask your health care provider. °Contact a health care provider if: °· You miss a dose of your blood thinner. °· Your menstrual period is heavier than usual. °· You have unusual bruising. °Get help right away if: °· You have: °? New or increased pain, swelling, or redness in an arm or leg. °? Numbness or tingling in an arm or leg. °? Shortness of breath. °? Chest pain. °? A rapid or irregular heartbeat. °? A severe headache or confusion. °? A cut that will not stop bleeding. °· There is blood in your vomit, stool, or urine. °· You have a serious fall or accident, or you hit your head. °· You feel light-headed or dizzy. °· You cough up blood. °These symptoms may represent a serious problem that is an emergency. Do not wait to see if the symptoms will go away. Get medical help right away. Call your local emergency services (911 in the [place] Do not drive yourself to the hospital. °Summary °· Deep vein thrombosis (DVT) is a condition in which a blood clot forms in a deep vein, such as a lower leg, thigh, or arm vein. °· Symptoms can include swelling, warmth, pain, and redness in your leg or arm. °· This condition may be treated with a blood thinner (anticoagulant medicine), medicine that is injected to dissolve blood clots,compression stockings, or surgery. °· If you are prescribed blood thinners, take them exactly as told. °This information is not intended to replace advice given to you by your health care provider. Make sure you discuss any questions you have with your health care provider. °Document Released: 03/07/2005 Document Revised: 02/17/2017 Document Reviewed: 08/05/2016 °Elsevier Patient Education © 2020 [employer] Inc[employer] ° °

## 2019-02-25 NOTE — Progress Notes (Signed)
New Patient Office Visit  Subjective:  Patient ID: Craig Blankenship, male    DOB: Oct 07, 1995  Age: 23 y.o. MRN: [id number]  CC:  Chief Complaint  Patient presents with  . Hospitalization Follow-up    PE and DVT    HPI Craig Blankenship presents for establishment of care and hospital follow up (VTE). Concern with affordability of xeralto.   Past Medical History:  Diagnosis Date  . Marijuana use     Past Surgical History:  Procedure Laterality Date  . IR ANGIOGRAM PULMONARY BILATERAL SELECTIVE  01/24/2019  . IR ANGIOGRAM SELECTIVE EACH ADDITIONAL VESSEL  01/24/2019  . IR ANGIOGRAM SELECTIVE EACH ADDITIONAL VESSEL  01/24/2019  . IR INFUSION THROMBOL ARTERIAL INITIAL (MS)  01/24/2019  . IR INFUSION THROMBOL ARTERIAL INITIAL (MS)  01/24/2019  . IR THROMB F/U EVAL ART/VEN FINAL DAY (MS)  01/25/2019  . IR US GUIDE VASC ACCESS RIGHT  01/24/2019  . Left knee surgery      History reviewed. No pertinent family history.  Social History   Socioeconomic History  . Marital status: Single    Spouse name: Not on file  . Number of children: Not on file  . Years of education: Not on file  . Highest education level: Not on file  Occupational History  . Not on file  Tobacco Use  . Smoking status: Never Smoker  . Smokeless tobacco: Never Used  Substance and Sexual Activity  . Alcohol use: Yes  . Drug use: Not Currently  . Sexual activity: Not on file  Other Topics Concern  . Not on file  Social History Narrative  . Not on file   Social Determinants of Health   Financial Resource Strain:   . Difficulty of Paying Living Expenses: Not on file  Food Insecurity:   . Worried About Programme researcher, broadcasting/film/video in the Last Year: Not on file  . Ran Out of Food in the Last Year: Not on file  Transportation Needs:   . Lack of Transportation (Medical): Not on file  . Lack of Transportation (Non-Medical): Not on file  Physical Activity:   . Days of Exercise per Week: Not on file  . Minutes of  Exercise per Session: Not on file  Stress:   . Feeling of Stress : Not on file  Social Connections:   . Frequency of Communication with Friends and Family: Not on file  . Frequency of Social Gatherings with Friends and Family: Not on file  . Attends Religious Services: Not on file  . Active Member of Clubs or Organizations: Not on file  . Attends Banker Meetings: Not on file  . Marital Status: Not on file  Intimate Partner Violence:   . Fear of Current or Ex-Partner: Not on file  . Emotionally Abused: Not on file  . Physically Abused: Not on file  . Sexually Abused: Not on file    ROS Review of Systems  All other systems reviewed and are negative.   Objective:   Today's Vitals: BP (!) 128/59 (BP Location: Right Arm, Patient Position: Sitting, Cuff Size: Large)   Pulse 74   Temp (!) 97.3 F (36.3 C) (Temporal)   Ht 6\' 1"  (1.854 m)   Wt 258 lb 9.6 oz (117.3 kg)   SpO2 98%   BMI 34.12 kg/m   Physical Exam Vitals signs reviewed.  Constitutional:      Appearance: Normal appearance. He is obese.  HENT:     Head: Normocephalic.  Right Ear: Tympanic membrane normal.     Left Ear: Tympanic membrane normal.     Nose: Nose normal.  Neck:     Musculoskeletal: Normal range of motion and neck supple.  Cardiovascular:     Rate and Rhythm: Normal rate and regular rhythm.  Pulmonary:     Effort: Pulmonary effort is normal.     Breath sounds: Normal breath sounds.  Abdominal:     General: Bowel sounds are normal. There is distension.     Palpations: Abdomen is soft.  Musculoskeletal: Normal range of motion.     Left lower leg: Edema present.  Skin:    General: Skin is warm and dry.  Neurological:     Mental Status: He is alert and oriented to person, place, and time.  Psychiatric:        Mood and Affect: Mood normal.        Thought Content: Thought content normal.        Judgment: Judgment normal.     Assessment & Plan:  Hanna was seen today for  hospitalization follow-up.  Diagnoses and all orders for this visit:  Acute deep vein thrombosis (DVT) of femoral vein of left lower extremity ([place]) Labs after reviewing may have a genetic component for DVT/PE.  Concerned that affordability to purchase xarelto requesting to change to coumadin. This maybe considered later.  Other pulmonary embolism without acute cor pulmonale, unspecified chronicity (HCC) Presumptuous this event  came from what he felt was a muscle strain in his calf due to moving furniture and was a DVT the divided to travel to both lungs. Patient is now understanding the severity of emboli are.   Encounter to establish care Juluis Mire, NP-C will be your  (PCP) mastered prepared that is able to that will  diagnosed and treatment able to answer health concern as well as continuing care of varied medical conditions, not limited by cause, organ system, or diagnosis.   Hospital discharge follow-up CTA revealed extensive bilateral pulmonary embolism with right-sided heart strain. He had a catheter associated thrombolysis 11/5. Echocardiogram-65-70% with reduced right RV function. Lower extremity Dopplers was positive for acute DVT. Recommended follow up with PCP in 2-3 weeks.  Other orders -     rivaroxaban (XARELTO) 20 MG TABS tablet; Take 1 tablet (20 mg total) by mouth daily with supper.   Outpatient Encounter Medications as of 02/25/2019  Medication Sig  . [DISCONTINUED] Rivaroxaban 15 & 20 MG TBPK Follow package directions: Take one 15mg  tablet by mouth twice a day. On day 22, switch to one 20mg  tablet once a day. Take with food.  Marland Kitchen acetaminophen (TYLENOL) 325 MG tablet Take 2 tablets (650 mg total) by mouth every 6 (six) hours as needed for mild pain (or Fever >/= 101). (Patient not taking: Reported on 02/25/2019)  . ondansetron (ZOFRAN) 4 MG tablet Take 1 tablet (4 mg total) by mouth every 6 (six) hours as needed for nausea. (Patient not taking: Reported on 02/25/2019)   . rivaroxaban (XARELTO) 20 MG TABS tablet Take 1 tablet (20 mg total) by mouth daily with supper.   No facility-administered encounter medications on file as of 02/25/2019.    Follow-up: Return in about 3 months (around 05/26/2019) for re-evaluate PE?DVT.   Kerin Perna, NP

## 2019-11-27 ENCOUNTER — Ambulatory Visit (HOSPITAL_COMMUNITY)
Admission: EM | Admit: 2019-11-27 | Discharge: 2019-11-27 | Disposition: A | Discharge status: Home / Self Care | Payer: Medicaid Other | Attending: Family Medicine | Admitting: Family Medicine

## 2019-11-27 ENCOUNTER — Encounter (HOSPITAL_COMMUNITY): Payer: Self-pay | Admitting: Emergency Medicine

## 2019-11-27 ENCOUNTER — Other Ambulatory Visit: Payer: Self-pay

## 2019-11-27 DIAGNOSIS — L03116 Cellulitis of left lower limb: Secondary | ICD-10-CM

## 2019-11-27 MED ORDER — DOXYCYCLINE HYCLATE 100 MG PO CAPS: 100.0000 mg | ORAL_CAPSULE | Freq: Two times a day (BID) | ORAL | 0 refills | Status: DC

## 2019-11-27 NOTE — ED Triage Notes (Signed)
Pt presents with possible spider bite on left lower leg xs 2 days. States burning and red.

## 2019-11-27 NOTE — ED Provider Notes (Signed)
[facility]   [id number] 11/27/19 Arrival Time: 1233  ASSESSMENT & PLAN:  1. Cellulitis of left lower extremity    No sign of abscess requiring I&D at this time.  Begin: Meds ordered this encounter  Medications  . doxycycline (VIBRAMYCIN) 100 MG capsule    Sig: Take 1 capsule (100 mg total) by mouth 2 (two) times daily.    Dispense:  14 capsule    Refill:  0     Follow-up Information    [facility].   Specialty: Urgent Care Why: If worsening or failing to improve as anticipated. Contact information: [address] [place] [address] [phone]              Reviewed expectations re: course of current medical issues. Questions answered. Outlined signs and symptoms indicating need for more acute intervention. Patient verbalized understanding. After Visit Summary given.   SUBJECTIVE:  Craig Blankenship is a 24 y.o. male who presents with a skin complaint. Reports erythematous bump on left lower leg; 2 days; questions insect bite; increasing erythema/warmth. No drainage/bleeding. Afebrile.   OBJECTIVE: Vitals:   11/27/19 1431  BP: 124/78  Pulse: 76  Resp: 18  Temp: 98.2 F (36.8 C)  TempSrc: Oral  SpO2: 98%    General appearance: alert; no distress HEENT: [place]; AT Extremities: no edema; moves all extremities normally Skin: warm and dry; inner left lower leg just above ankle is an approx 2x2 area of erythema/warmth with central skin thickening; tender to touch; no drainage or bleeding Psychological: alert and cooperative; normal mood and affect  No Known Allergies  Past Medical History:  Diagnosis Date  . Marijuana use    Social History   Socioeconomic History  . Marital status: Single    Spouse name: Not on file  . Number of children: Not on file  . Years of education: Not on file  . Highest education level: Not on file  Occupational History  . Not on file  Tobacco Use  . Smoking status:  Never Smoker  . Smokeless tobacco: Never Used  Substance and Sexual Activity  . Alcohol use: Yes  . Drug use: Not Currently  . Sexual activity: Not on file  Other Topics Concern  . Not on file  Social History Narrative  . Not on file   Social Determinants of Health   Financial Resource Strain:   . Difficulty of Paying Living Expenses: Not on file  Food Insecurity:   . Worried About Programme researcher, broadcasting/film/video in the Last Year: Not on file  . Ran Out of Food in the Last Year: Not on file  Transportation Needs:   . Lack of Transportation (Medical): Not on file  . Lack of Transportation (Non-Medical): Not on file  Physical Activity:   . Days of Exercise per Week: Not on file  . Minutes of Exercise per Session: Not on file  Stress:   . Feeling of Stress : Not on file  Social Connections:   . Frequency of Communication with Friends and Family: Not on file  . Frequency of Social Gatherings with Friends and Family: Not on file  . Attends Religious Services: Not on file  . Active Member of Clubs or Organizations: Not on file  . Attends Banker Meetings: Not on file  . Marital Status: Not on file  Intimate Partner Violence:   . Fear of Current or Ex-Partner: Not on file  . Emotionally Abused: Not on  file  . Physically Abused: Not on file  . Sexually Abused: Not on file   History reviewed. No pertinent family history. Past Surgical History:  Procedure Laterality Date  . IR ANGIOGRAM PULMONARY BILATERAL SELECTIVE  01/24/2019  . IR ANGIOGRAM SELECTIVE EACH ADDITIONAL VESSEL  01/24/2019  . IR ANGIOGRAM SELECTIVE EACH ADDITIONAL VESSEL  01/24/2019  . IR INFUSION THROMBOL ARTERIAL INITIAL (MS)  01/24/2019  . IR INFUSION THROMBOL ARTERIAL INITIAL (MS)  01/24/2019  . IR THROMB F/U EVAL ART/VEN FINAL DAY (MS)  01/25/2019  . IR US GUIDE VASC ACCESS RIGHT  01/24/2019  . Left knee surgery       Mardella Layman, MD 11/27/19 1454

## 2019-11-28 ENCOUNTER — Other Ambulatory Visit: Payer: Self-pay

## 2019-11-28 ENCOUNTER — Encounter (HOSPITAL_COMMUNITY): Payer: Self-pay | Admitting: Emergency Medicine

## 2019-11-28 ENCOUNTER — Ambulatory Visit (HOSPITAL_COMMUNITY)
Admission: EM | Admit: 2019-11-28 | Discharge: 2019-11-28 | Disposition: A | Discharge status: Home / Self Care | Payer: Medicaid Other | Attending: Family Medicine | Admitting: Family Medicine

## 2019-11-28 ENCOUNTER — Telehealth (HOSPITAL_COMMUNITY): Payer: Self-pay | Admitting: Emergency Medicine

## 2019-11-28 DIAGNOSIS — L03116 Cellulitis of left lower limb: Secondary | ICD-10-CM

## 2019-11-28 MED ORDER — LIDOCAINE HCL (PF) 1 % IJ SOLN
INTRAMUSCULAR | Status: AC
  Filled 2019-11-28: qty 2

## 2019-11-28 MED ORDER — DOXYCYCLINE HYCLATE 100 MG PO CAPS: 100.0000 mg | ORAL_CAPSULE | Freq: Two times a day (BID) | ORAL | 0 refills | Status: AC

## 2019-11-28 MED ORDER — CEFTRIAXONE SODIUM 1 G IJ SOLR
1.0000 g | Freq: Once | INTRAMUSCULAR | Status: AC
  Administered 2019-11-28: 1 g via INTRAMUSCULAR

## 2019-11-28 MED ORDER — CEFTRIAXONE SODIUM 1 G IJ SOLR
INTRAMUSCULAR | Status: AC
  Filled 2019-11-28: qty 10

## 2019-11-28 NOTE — ED Triage Notes (Signed)
Pt c/o left leg spider bite onset 3-4 days ago. Pt states he has not had the prescription filled yet but it is getting worse and throbbing and seems more red.

## 2019-11-28 NOTE — ED Provider Notes (Signed)
[facility]    CSN: [id number] Arrival date & time: 11/28/19  7628      History   Chief Complaint Chief Complaint  Patient presents with  . Insect Bite    HPI Craig Blankenship is a 24 y.o. male.   24 year old male presents today for possible insect bite to left lower extremity.  This was first noticed about 3 to 4 days ago.  Was seen here yesterday and treated with doxycycline.  He is not started the medication yet.  He was concerned due to the increased pain and swelling.  Otherwise no fevers, chills, body aches, fatigue, night sweats, nausea or vomiting.     Past Medical History:  Diagnosis Date  . Marijuana use     Patient Active Problem List   Diagnosis Date Noted  . Pulmonary embolism (HCC) 01/24/2019  . DVT of left leg 01/24/2019  . Syncope and collapse 01/24/2019  . Hyperglycemia 01/24/2019  . AKI (acute kidney injury) (HCC) 01/24/2019  . Marijuana use 01/24/2019    Past Surgical History:  Procedure Laterality Date  . IR ANGIOGRAM PULMONARY BILATERAL SELECTIVE  01/24/2019  . IR ANGIOGRAM SELECTIVE EACH ADDITIONAL VESSEL  01/24/2019  . IR ANGIOGRAM SELECTIVE EACH ADDITIONAL VESSEL  01/24/2019  . IR INFUSION THROMBOL ARTERIAL INITIAL (MS)  01/24/2019  . IR INFUSION THROMBOL ARTERIAL INITIAL (MS)  01/24/2019  . IR THROMB F/U EVAL ART/VEN FINAL DAY (MS)  01/25/2019  . IR US GUIDE VASC ACCESS RIGHT  01/24/2019  . Left knee surgery         Home Medications    Prior to Admission medications   Medication Sig Start Date End Date Taking? Authorizing Provider  acetaminophen (TYLENOL) 325 MG tablet Take 2 tablets (650 mg total) by mouth every 6 (six) hours as needed for mild pain (or Fever >/= 101). Patient not taking: Reported on 02/25/2019 01/30/19   Thomasenia Bottoms, MD  doxycycline (VIBRAMYCIN) 100 MG capsule Take 1 capsule (100 mg total) by mouth 2 (two) times daily. 11/27/19   Mardella Layman, MD  ondansetron (ZOFRAN) 4 MG tablet Take 1 tablet (4 mg total)  by mouth every 6 (six) hours as needed for nausea. Patient not taking: Reported on 02/25/2019 01/30/19   Thomasenia Bottoms, MD  rivaroxaban (XARELTO) 20 MG TABS tablet Take 1 tablet (20 mg total) by mouth daily with supper. 02/25/19   Grayce Sessions, NP    Family History Family History  Problem Relation Age of Onset  . Healthy Mother   . Healthy Father     Social History Social History   Tobacco Use  . Smoking status: Never Smoker  . Smokeless tobacco: Never Used  Vaping Use  . Vaping Use: Never used  Substance Use Topics  . Alcohol use: Yes  . Drug use: Not Currently     Allergies   Patient has no known allergies.   Review of Systems Review of Systems   Physical Exam Triage Vital Signs ED Triage Vitals  Enc Vitals Group     BP 11/28/19 1001 (!) 142/81     Pulse Rate 11/28/19 1001 79     Resp 11/28/19 1001 18     Temp 11/28/19 1001 98.3 F (36.8 C)     Temp Source 11/28/19 1001 Oral     SpO2 11/28/19 1001 100 %     Weight --      Height --      Head Circumference --      Peak Flow --  Pain Score 11/28/19 0958 7     Pain Loc --      Pain Edu? --      Excl. in GC? --    No data found.  Updated Vital Signs BP (!) 142/81 (BP Location: Left Arm)   Pulse 79   Temp 98.3 F (36.8 C) (Oral)   Resp 18   SpO2 100%   Visual Acuity Right Eye Distance:   Left Eye Distance:   Bilateral Distance:    Right Eye Near:   Left Eye Near:    Bilateral Near:     Physical Exam Vitals and nursing note reviewed.  Constitutional:      Appearance: Normal appearance.  HENT:     Head: Normocephalic and atraumatic.  Eyes:     Conjunctiva/sclera: Conjunctivae normal.  Pulmonary:     Effort: Pulmonary effort is normal.  Musculoskeletal:        General: Normal range of motion.     Cervical back: Normal range of motion.  Skin:    General: Skin is warm and dry.     Comments: See picture for detail   Neurological:     Mental Status: He is alert.  Psychiatric:         Mood and Affect: Mood normal.        UC Treatments / Results  Labs (all labs ordered are listed, but only abnormal results are displayed) Labs Reviewed - No data to display  EKG   Radiology No results found.  Procedures Procedures (including critical care time)  Medications Ordered in UC Medications  cefTRIAXone (ROCEPHIN) injection 1 g (1 g Intramuscular Given 11/28/19 1034)    Initial Impression / Assessment and Plan / UC Course  I have reviewed the triage vital signs and the nursing notes.  Pertinent labs & imaging results that were available during my care of the patient were reviewed by me and considered in my medical decision making (see chart for details).     Cellulitis of the left lower extremity most likely due to insect bite. Reinforced importance to start the doxycycline today.  Will give Rocephin injection here in clinic to initiate treatment Recommend warm compresses and tylenol as needed Follow up as needed for continued or worsening symptoms  Final Clinical Impressions(s) / UC Diagnoses   Final diagnoses:  Cellulitis of left lower extremity     Discharge Instructions     Antibiotic injection given here today.  Make sure you start the doxycycline to day.  Please come back in a few days for recheck if symptoms are not improving.  Sooner if worse, you can do warm compresses to the area.      ED Prescriptions    None     PDMP not reviewed this encounter.   Dahlia Byes A, NP 11/28/19 1037

## 2019-11-28 NOTE — Discharge Instructions (Addendum)
Antibiotic injection given here today.  Make sure you start the doxycycline to day.  Please come back in a few days for recheck if symptoms are not improving.  Sooner if worse, you can do warm compresses to the area.

## 2020-10-24 IMAGING — US IR INFUSION THROMBOL ARTERIAL INITIAL (MS)
1 series · 1 of 1 positions shown · non-contrast
Comparison: CTA chest 01/24/2019

INDICATION: 23-year-old male with unprovoked large volume bilateral pulmonary
emboli and evidence of right heart strain on both CT angiography and
echocardiography. As such, he has intermediate-high risk
(submassive) pulmonary emboli. He presents now for bilateral
catheter directed thrombolysis.
TECHNIQUE: Informed written consent was obtained from the patient after a
thorough discussion of the procedural risks, benefits and
alternatives. All questions were addressed. Maximal Sterile Barrier
Technique was utilized including caps, mask, sterile gowns, sterile
gloves, sterile drape, hand hygiene and skin antiseptic. A timeout
was performed prior to the initiation of the procedure.

[Series 1: ir infusion thrombol arterial initial (ms) · 1 of 1 slices shown]
[im 1/1]
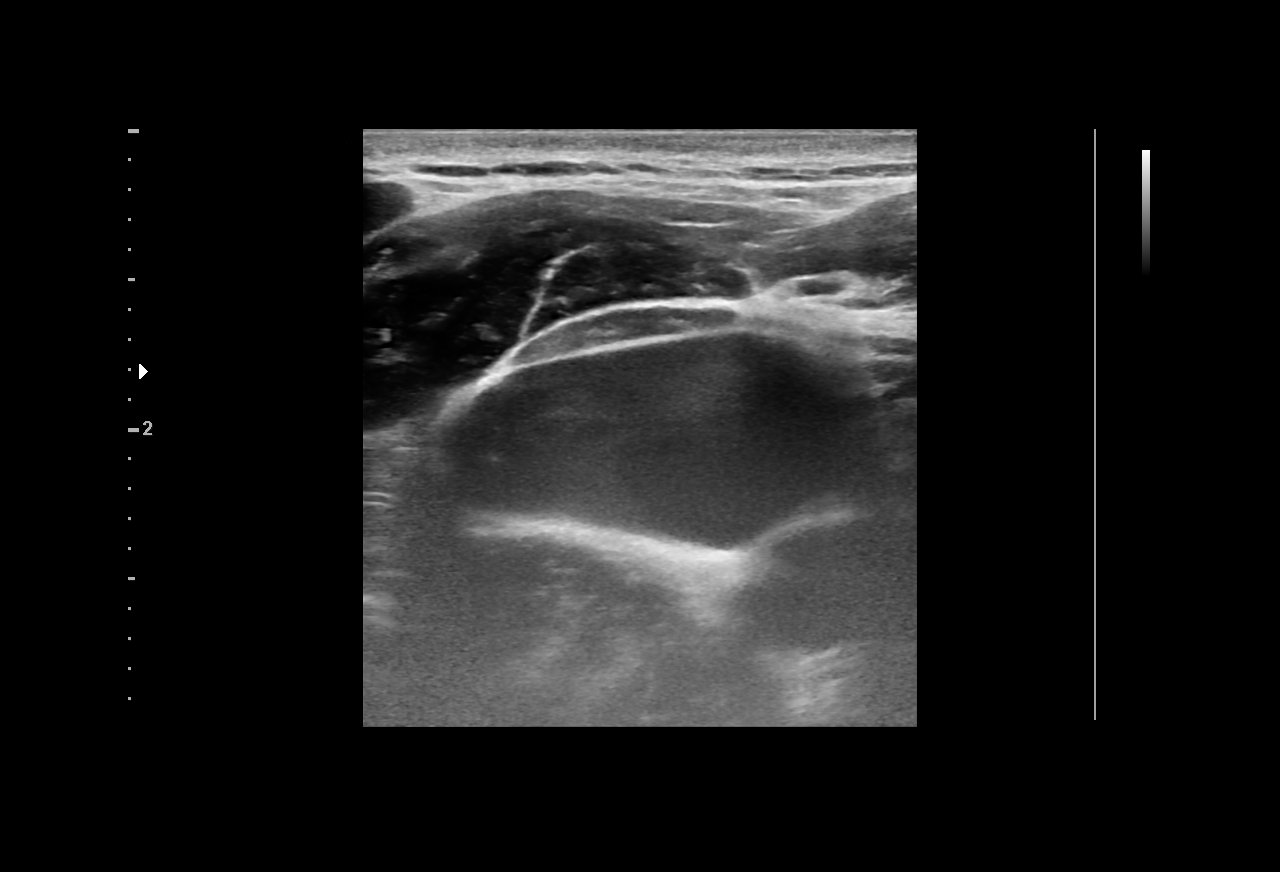

[1 of 1 positions shown; findings below may reference images not displayed]

EXAM:
IR ULTRASOUND GUIDANCE VASC ACCESS RIGHT; ADDITIONAL ARTERIOGRAPHY;
IR INFUSION THROMBOL ARTERIAL INITIAL (MS); BILATERAL PULMONARY
ARTERIOGRAPHY

1. Ultrasound-guided access right internal jugular vein
2. Second ultrasound-guided access right internal jugular vein
3. Catheterization of the main pulmonary artery with pulmonary
arteriogram and pressure measurements
4. Catheterization of the right main pulmonary artery with pulmonary
arteriogram
5. Catheterization of the right lower lobe pulmonary artery with
initiation of pulmonary arterial thrombolysis
6. Catheterization of the left main pulmonary artery with
arteriogram
7. Catheterization of the left lower lobe pulmonary artery
8. Initiation of left pulmonary arterial thrombolysis
MEDICATIONS:
2 mg Versed for anxiety lysis.

ANESTHESIA/SEDATION:
Conscious sedation was not administered.

FLUOROSCOPY TIME:  Fluoroscopy Time: 13 minutes 18 seconds (68 mGy).

COMPLICATIONS:
None immediate.
The right internal jugular vein was interrogated with ultrasound and
found to be widely patent. An image was obtained and stored for the
medical record. Local anesthesia was attained by infiltration with
1% lidocaine. A small dermatotomy was made. Under real-time
sonographic guidance, the vessel was punctured with a 21 gauge
micropuncture needle. Using standard technique, the initial micro
needle was exchanged over a 0.018 micro wire for a transitional 4
French micro sheath. The micro sheath was then exchanged over a
0.035 wire for a 6 French vascular sheath. Using the same technique,
a second ultrasound-guided puncture was performed in the right
internal jugular vein and a second 6 French vascular sheath
inserted.

An angled pigtail catheter was then advanced over a Bentson wire and
navigated through the right atrium, right ventricle and into the
main pulmonary artery. A pulmonary arteriogram was performed.
Extensive bilateral PE are visible. There is relatively poor
peripheral pulmonary perfusion. Pressure measurements were then
obtained. The main pulmonary arterial mean pressure is 32 mm Hg.

The pigtail catheter was then navigated into the right main
pulmonary artery. A right pulmonary arteriogram was performed. There
is nearly occlusive thrombus within the right main pulmonary artery
extending into the truncus anterior and right lower lobe pulmonary
artery. Diminished flow is visible peripherally.

The angle pigtail catheter was removed over a rose in wire for an
angled 5 French catheter. Utilizing a Bentson wire, the wire and
catheter were navigated into a right lower lobe pulmonary artery.
The catheter was then removed. A 10 cm infusion length Uni fuse
infusion catheter was then advanced over the wire and positioned
across the thrombus in the right main and right lower lobe pulmonary
arteries. A total of 2 mg tPA was then injected through the infusion
catheter directly into the thrombus. The catheter was gently flushed
and capped.

Attention was turned to the second vascular sheath. The angled
pigtail catheter was reintroduced over a Bentson wire and navigated
into the main pulmonary artery. The catheter was then exchanged over
the rose in wire for the angled 5 French catheter which was used to
select the left main pulmonary artery. Arteriography was performed.
Significant and nearly occlusive thrombus present in the left lower
lobe pulmonary artery with diminished peripheral perfusion in the
lung base.

The Bentson wire was advanced distally in the left lower lobe
pulmonary artery followed by the catheter. The wire was then
exchanged for a rose in wire. The catheter was removed. A second 10
cm infusion length Uni fuse infusion catheter was advanced over the
wire and positioned across the thrombus in the left lower lobe
pulmonary artery. 2 mg of tPA were then injected through the multi
side-hole infusion catheter and into the thrombus. The catheter was
gently flushed and capped.

Both catheters were secured to the skin with sterile bandages. The
catheters were then connected to tPA drip at 1 milligram/hour per
catheter.
FINDINGS: Main mean pulmonary arterial pressure 33 mm of Hg consistent with
pulmonary arterial hypertension.
IMPRESSION: 1. Large volume bilateral pulmonary emboli with right heart failure
and acute pulmonary arterial hypertension.
2. Successful initiation of bilateral catheter directed pulmonary
arterial thrombolysis.

## 2023-01-20 ENCOUNTER — Encounter (HOSPITAL_COMMUNITY): Payer: Self-pay

## 2023-01-20 ENCOUNTER — Emergency Department (HOSPITAL_COMMUNITY)
Admission: EM | Admit: 2023-01-20 | Discharge: 2023-01-20 | Disposition: A | Discharge status: Home / Self Care | Payer: Medicaid Other | Attending: Emergency Medicine | Admitting: Emergency Medicine

## 2023-01-20 ENCOUNTER — Emergency Department (HOSPITAL_BASED_OUTPATIENT_CLINIC_OR_DEPARTMENT_OTHER)
Admit: 2023-01-20 | Discharge: 2023-01-20 | Disposition: A | Discharge status: Home / Self Care | Payer: Medicaid Other | Attending: Emergency Medicine | Admitting: Emergency Medicine

## 2023-01-20 ENCOUNTER — Other Ambulatory Visit (HOSPITAL_COMMUNITY): Payer: Self-pay

## 2023-01-20 DIAGNOSIS — L03116 Cellulitis of left lower limb: Secondary | ICD-10-CM | POA: Insufficient documentation

## 2023-01-20 DIAGNOSIS — I82432 Acute embolism and thrombosis of left popliteal vein: Secondary | ICD-10-CM | POA: Insufficient documentation

## 2023-01-20 DIAGNOSIS — Z7901 Long term (current) use of anticoagulants: Secondary | ICD-10-CM | POA: Insufficient documentation

## 2023-01-20 DIAGNOSIS — M79662 Pain in left lower leg: Secondary | ICD-10-CM

## 2023-01-20 DIAGNOSIS — I82532 Chronic embolism and thrombosis of left popliteal vein: Secondary | ICD-10-CM

## 2023-01-20 HISTORY — DX: Other pulmonary embolism without acute cor pulmonale: I26.99

## 2023-01-20 HISTORY — DX: Acute embolism and thrombosis of unspecified deep veins of unspecified lower extremity: I82.409

## 2023-01-20 LAB — CBC WITH DIFFERENTIAL/PLATELET
Abs Immature Granulocytes: 0.04 10*3/uL (ref 0.00–0.07)
Basophils Absolute: 0 10*3/uL (ref 0.0–0.1)
Basophils Relative: 0 %
Eosinophils Absolute: 0.1 10*3/uL (ref 0.0–0.5)
Eosinophils Relative: 1 %
HCT: 41.4 % (ref 39.0–52.0)
Hemoglobin: 14.5 g/dL (ref 13.0–17.0)
Immature Granulocytes: 0 %
Lymphocytes Relative: 20 %
Lymphs Abs: 1.9 10*3/uL (ref 0.7–4.0)
MCH: 36.8 pg — ABNORMAL HIGH (ref 26.0–34.0)
MCHC: 35 g/dL (ref 30.0–36.0)
MCV: 105.1 fL — ABNORMAL HIGH (ref 80.0–100.0)
Monocytes Absolute: 0.8 10*3/uL (ref 0.1–1.0)
Monocytes Relative: 9 %
Neutro Abs: 6.4 10*3/uL (ref 1.7–7.7)
Neutrophils Relative %: 70 %
Platelets: 261 10*3/uL (ref 150–400)
RBC: 3.94 MIL/uL — ABNORMAL LOW (ref 4.22–5.81)
RDW: 14.6 % (ref 11.5–15.5)
WBC: 9.2 10*3/uL (ref 4.0–10.5)
nRBC: 0 % (ref 0.0–0.2)

## 2023-01-20 LAB — BASIC METABOLIC PANEL
Anion gap: 8 (ref 5–15)
BUN: 6 mg/dL (ref 6–20)
CO2: 26 mmol/L (ref 22–32)
Calcium: 9.2 mg/dL (ref 8.9–10.3)
Chloride: 102 mmol/L (ref 98–111)
Creatinine, Ser: 1.03 mg/dL (ref 0.61–1.24)
GFR, Estimated: >60 mL/min (ref >60)
Glucose, Bld: 117 mg/dL — ABNORMAL HIGH (ref 70–99)
Potassium: 4.7 mmol/L (ref 3.5–5.1)
Sodium: 136 mmol/L (ref 135–145)

## 2023-01-20 MED ORDER — APIXABAN (ELIQUIS) VTE STARTER PACK (10MG AND 5MG)
ORAL_TABLET | ORAL | 0 refills | Status: AC
  Filled 2023-01-20: qty 74, 30d supply, fill #0

## 2023-01-20 MED ORDER — CEPHALEXIN 500 MG PO CAPS
500.0000 mg | ORAL_CAPSULE | Freq: Four times a day (QID) | ORAL | 0 refills | Status: AC
  Filled 2023-01-20: qty 28, 7d supply, fill #0

## 2023-01-20 MED ORDER — CEPHALEXIN 250 MG PO CAPS
500.0000 mg | ORAL_CAPSULE | Freq: Once | ORAL | Status: AC
  Administered 2023-01-20: 500 mg via ORAL
  Filled 2023-01-20: qty 2

## 2023-01-20 MED ORDER — APIXABAN 5 MG PO TABS
10.0000 mg | ORAL_TABLET | Freq: Once | ORAL | Status: AC
  Administered 2023-01-20: 10 mg via ORAL
  Filled 2023-01-20: qty 2

## 2023-01-20 NOTE — TOC Benefit Eligibility Note (Signed)
Patient Product/process development scientist completed.    The patient is insured through [facility]. Patient has [employer], may use a copay card, and/or apply for patient assistance if available.    Ran test claim for Eliquis 5 mg and the current 30 day co-pay is $15.00.  Ran test claim for Xarelto 20 mg and the current 30 day co-pay is $15.00.  This test claim was processed through [facility]- copay amounts may vary at other pharmacies due to pharmacy/plan contracts, or as the patient moves through the different stages of their insurance plan.     Roland Earl, CPHT Pharmacy Technician III Certified Patient Advocate [facility] Patient Advocate Team Direct Number: [phone]  Fax: [phone]

## 2023-01-20 NOTE — Discharge Instructions (Signed)
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  You are being discharged on Eliquis which is a blood thinner.  This will increase your risk for bleeding so if you have any trauma or head injury you need to be evaluated in the emergency department.  You will likely need to be on this medicine for at least several months and you need to follow-up with a doctor within the next week.  You also have an early infection of the skin on your left leg.  You are being discharged on antibiotics and should complete the entire course.  If you develop severe pain, worsening swelling or redness, chest pain, dizziness, difficulty breathing or fainting you need to return to the emergency department.

## 2023-01-20 NOTE — ED Notes (Signed)
Patient given TOc discharge medications.  Eliquis and Cephalexin.

## 2023-01-20 NOTE — ED Triage Notes (Signed)
Hx of left leg DVT and PE on 2020. Currently not on blood thinners. Here for left leg pain, swelling, redness x 2 days. Ambulatory.

## 2023-01-20 NOTE — Progress Notes (Signed)
Left lower extremity venous duplex has been completed. Preliminary results can be found in CV Proc through chart review.  Results were given to Dr. Earlene Plater.  01/20/23 9:40 AM Olen Cordial RVT

## 2023-01-20 NOTE — ED Provider Notes (Signed)
[place] EMERGENCY DEPARTMENT AT [facility] Provider Note   CSN: [id number] Arrival date & time: 01/20/23  4098     History  Chief Complaint  Patient presents with   Leg Pain    Craig Blankenship is a 27 y.o. male.   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  He states for last 2 days he has had some swelling, pain to his left calf.  No recent travel or immobilization.  He states he was admitted in 2020 for 11 days for PE and DVT with right heart strain.  He was discharged on a month of Xarelto but never followed up so is no longer taking this.  No blood clots since as far as he knows.  He initially presented with syncope and hypoxia during that episode.  He said no chest pain, shortness of breath, dizziness, fainting and states this episode feels very different than when he had his PE.  No fevers or cough or nausea or vomiting.  No rash.     Home Medications Prior to Admission medications   Medication Sig Start Date End Date Taking? Authorizing Provider  APIXABAN Everlene Balls) VTE STARTER PACK (10MG  AND 5MG ) Take as directed on package: start with two-5mg  tablets twice daily for 7 days. On day 8, switch to one-5mg  tablet twice daily. 01/20/23  Yes Laurence Spates, MD  cephALEXin (KEFLEX) 500 MG capsule Take 1 capsule (500 mg total) by mouth 4 (four) times daily for 7 days. 01/20/23 01/27/23 Yes Laurence Spates, MD  acetaminophen (TYLENOL) 325 MG tablet Take 2 tablets (650 mg total) by mouth every 6 (six) hours as needed for mild pain (or Fever >/= 101). Patient not taking: Reported on 02/25/2019 01/30/19   Thomasenia Bottoms, MD  doxycycline (VIBRAMYCIN) 100 MG capsule Take 1 capsule (100 mg total) by mouth 2 (two) times daily. 11/28/19   Bast, Gloris Manchester A, NP  ondansetron (ZOFRAN) 4 MG tablet Take 1 tablet (4 mg total) by mouth every 6 (six) hours as needed for nausea. Patient not taking: Reported on  02/25/2019 01/30/19   Thomasenia Bottoms, MD      Allergies    Patient has no known allergies.    Review of Systems   Review of Systems Review of systems completed and notable as per HPI.  ROS otherwise negative.   Physical Exam Updated Vital Signs BP (!) 143/81   Pulse (!) 102   Temp 98 F (36.7 C) (Oral)   Resp 20   Ht 6\' 1"  (1.854 m)   Wt 108.9 kg   SpO2 99%   BMI 31.66 kg/m  Physical Exam Vitals and nursing note reviewed.  Constitutional:      General: He is not in acute distress.    Appearance: He is well-developed.  HENT:     Head: Normocephalic and atraumatic.  Eyes:     Conjunctiva/sclera: Conjunctivae normal.  Cardiovascular:     Rate and Rhythm: Normal rate and regular rhythm.     Pulses: Normal pulses.     Heart sounds: Normal heart sounds. No murmur heard. Pulmonary:     Effort: Pulmonary effort is normal. No respiratory distress.     Breath sounds: Normal breath sounds.  Abdominal:     Palpations: Abdomen is soft.     Tenderness: There is no abdominal tenderness.  Musculoskeletal:        General: No swelling.  Cervical back: Neck supple.     Right lower leg: No edema.     Left lower leg: Edema present.     Comments: Slight 1+ pitting edema on the left lower extremity.  2+ DP and PT pulse.  Small area of pinkish slightly erythematous skin over the medial left thigh.  No fluctuance or crepitus.  Mild tenderness to the calf.  Right leg is normal.  Skin:    General: Skin is warm and dry.     Capillary Refill: Capillary refill takes less than 2 seconds.  Neurological:     Mental Status: He is alert.  Psychiatric:        Mood and Affect: Mood normal.     ED Results / Procedures / Treatments   Labs (all labs ordered are listed, but only abnormal results are displayed) Labs Reviewed  CBC WITH DIFFERENTIAL/PLATELET - Abnormal; Notable for the following components:      Result Value   RBC 3.94 (*)    MCV 105.1 (*)    MCH 36.8 (*)    All other  components within normal limits  BASIC METABOLIC PANEL - Abnormal; Notable for the following components:   Glucose, Bld 117 (*)    All other components within normal limits    EKG None  Radiology VAS [place] LOWER EXTREMITY VENOUS (DVT) (7a-7p)  Result Date: 01/20/2023  Lower Venous DVT Study Patient Name:  Craig Blankenship  Date of Exam:   01/20/2023 Medical Rec #: [id number]           Accession #:    [id number] Date of Birth: February 22, 1996           Patient Gender: M Patient Age:   52 years Exam Location:  [facility] Procedure:      VAS [place] LOWER EXTREMITY VENOUS (DVT) Referring Phys: Marja Kays Christopher Glasscock --------------------------------------------------------------------------------  Indications: Pain.  Risk Factors: DVT Remote history of DVT and PE. Limitations: Poor ultrasound/tissue interface. Comparison Study: 01/24/2019 - Right: There is no evidence of deep vein                   thrombosis in the lower                   extremity. No cystic structure found in the popliteal fossa.                   Left: Findings consistent with acute deep vein thrombosis                   involving the                   left common femoral vein, and left femoral vein. No cystic                   structure found                   in the popliteal fossa. Performing Technologist: Chanda Busing RVT  Examination Guidelines: A complete evaluation includes B-mode imaging, spectral Doppler, color Doppler, and power Doppler as needed of all accessible portions of each vessel. Bilateral testing is considered an integral part of a complete examination. Limited examinations for reoccurring indications may be performed as noted. The reflux portion of the exam is performed with the patient in reverse Trendelenburg.  +-----+---------------+---------+-----------+----------+--------------+ RIGHTCompressibilityPhasicitySpontaneityPropertiesThrombus Aging +-----+---------------+---------+-----------+----------+--------------+  CFV  Full           Yes  Yes                                 +-----+---------------+---------+-----------+----------+--------------+   +---------+---------------+---------+-----------+----------+--------------+ LEFT     CompressibilityPhasicitySpontaneityPropertiesThrombus Aging +---------+---------------+---------+-----------+----------+--------------+ CFV      Full           Yes      Yes                                 +---------+---------------+---------+-----------+----------+--------------+ SFJ      Full                                                        +---------+---------------+---------+-----------+----------+--------------+ FV Prox  Full                                                        +---------+---------------+---------+-----------+----------+--------------+ FV Mid   Full                                                        +---------+---------------+---------+-----------+----------+--------------+ FV DistalFull                                                        +---------+---------------+---------+-----------+----------+--------------+ PFV      Full                                                        +---------+---------------+---------+-----------+----------+--------------+ POP      Partial        No       No                   Chronic        +---------+---------------+---------+-----------+----------+--------------+ PTV      Full                                                        +---------+---------------+---------+-----------+----------+--------------+ PERO     Full                                                        +---------+---------------+---------+-----------+----------+--------------+ Soleal   Full                                                        +---------+---------------+---------+-----------+----------+--------------+  Gastroc  Full                                                         +---------+---------------+---------+-----------+----------+--------------+     Summary: RIGHT: - No evidence of common femoral vein obstruction.   LEFT: - Findings consistent with chronic deep vein thrombosis involving the left popliteal vein.  - No cystic structure found in the popliteal fossa.  *See table(s) above for measurements and observations.    Preliminary     Procedures Procedures    Medications Ordered in ED Medications  cephALEXin (KEFLEX) capsule 500 mg (500 mg Oral Given 01/20/23 1059)  apixaban (ELIQUIS) tablet 10 mg (10 mg Oral Given 01/20/23 1059)    ED Course/ Medical Decision Making/ A&P Clinical Course as of 01/20/23 1108  Fri Jan 20, 2023  0940 [place]: popliteal chronic DVT [JD]  1051 On reassessment he is doing okay.  Ultrasound shows chronic popliteal vein DVT.  I think he needs to be on anticoagulation.  I reassessed him.  He has pain over his medial left calf.  There is some slight pinkish erythema here concern for possible early cellulitis.  No trauma to the area.  No fluctuance or signs of abscess or necrotizing infection.  He is afebrile, no leukocytosis or signs of systemic infection.  Tachycardia that was present on triage has resolved.  Discussed with pharmacy, will place on Eliquis which we are able to help him get and Keflex for possible early cellulitis. [JD]    Clinical Course User Index [JD] Laurence Spates, MD                                 Medical Decision Making Amount and/or Complexity of Data Reviewed Labs: ordered.  Risk Prescription drug management.   Medical Decision Making:   GARV KUECHLE is a 27 y.o. male who presented to the ED today with left leg pain.  Vital signs reviewed.  Exam he is well-appearing.  He has some slight pitting edema to left lower extremity and tenderness to the calf.  I am concerned for DVT especially with his history.  He also has some mild erythema to the medial left calf concerning for  possible early cellulitis, no signs of abscess or necrotizing infection.  Good pulses and is neurovascularly intact.  No trauma.  He has history of PE, however here has no hypoxia, chest pain, shortness of breath, dizziness and states this feels very different from his PE.   Patient placed on continuous vitals and telemetry monitoring while in ED which was reviewed periodically.  Reviewed and confirmed nursing documentation for past medical history, family history, social history.  Reassessment and Plan:   On reassessment he is doing okay.  Ultrasound shows chronic popliteal vein DVT.  I reassessed him.  He has pain over his medial left calf.  There is some slight pinkish erythema here concern for possible early cellulitis.  No trauma to the area.  No fluctuance or signs of abscess or necrotizing infection.  He is afebrile, no leukocytosis or signs of systemic infection.  Tachycardia that was present on triage has resolved.  No signs of PE.  Discussed with pharmacy, will place on Eliquis which we are able to help him get  and Keflex for possible early cellulitis.  We are able to get him his first month of medications as well as his Keflex prescription.  I also placed a referral for follow-up in the anticoagulation clinic.  I gave him multiple phone numbers to call to schedule with a PCP as well.  I emphasized the importance of him following up closely for further hypercoagulability workup that was not completed earlier as well as recheck of his cellulitis and DVT.  I given strict return precautions for any new or worsening symptoms.  He was comfortable and voiced understanding.   Patient's presentation is most consistent with acute complicated illness / injury requiring diagnostic workup.           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   01/20/23 1054    APIXABAN (ELIQUIS) VTE STARTER PACK (10MG  AND 5MG )       Note to Pharmacy: If starter pack unavailable, substitute with seventy-four 5 mg apixaban tabs following the above SIG directions.   01/20/23 1055    cephALEXin (KEFLEX) 500 MG capsule  4 times daily        01/20/23 1055              Laurence Spates, MD 01/20/23 1108

## 2023-01-21 ENCOUNTER — Encounter (HOSPITAL_COMMUNITY): Payer: Medicaid Other

## 2023-01-21 ENCOUNTER — Other Ambulatory Visit: Payer: Self-pay

## 2023-01-21 ENCOUNTER — Other Ambulatory Visit (HOSPITAL_COMMUNITY): Payer: Self-pay

## 2023-01-21 ENCOUNTER — Encounter (HOSPITAL_COMMUNITY): Payer: Self-pay

## 2023-01-21 ENCOUNTER — Emergency Department (HOSPITAL_COMMUNITY)
Admission: EM | Admit: 2023-01-21 | Discharge: 2023-01-21 | Disposition: A | Discharge status: Home / Self Care | Payer: Medicaid Other | Attending: Emergency Medicine | Admitting: Emergency Medicine

## 2023-01-21 DIAGNOSIS — I82432 Acute embolism and thrombosis of left popliteal vein: Secondary | ICD-10-CM | POA: Insufficient documentation

## 2023-01-21 DIAGNOSIS — Z7901 Long term (current) use of anticoagulants: Secondary | ICD-10-CM | POA: Insufficient documentation

## 2023-01-21 LAB — BASIC METABOLIC PANEL
Anion gap: 12 (ref 5–15)
BUN: 7 mg/dL (ref 6–20)
CO2: 20 mmol/L — ABNORMAL LOW (ref 22–32)
Calcium: 9.3 mg/dL (ref 8.9–10.3)
Chloride: 105 mmol/L (ref 98–111)
Creatinine, Ser: 0.84 mg/dL (ref 0.61–1.24)
GFR, Estimated: >60 mL/min (ref >60)
Glucose, Bld: 110 mg/dL — ABNORMAL HIGH (ref 70–99)
Potassium: 3.9 mmol/L (ref 3.5–5.1)
Sodium: 137 mmol/L (ref 135–145)

## 2023-01-21 LAB — CBC WITH DIFFERENTIAL/PLATELET
Abs Immature Granulocytes: 0.03 10*3/uL (ref 0.00–0.07)
Basophils Absolute: 0 10*3/uL (ref 0.0–0.1)
Basophils Relative: 0 %
Eosinophils Absolute: 0.1 10*3/uL (ref 0.0–0.5)
Eosinophils Relative: 1 %
HCT: 42.2 % (ref 39.0–52.0)
Hemoglobin: 15 g/dL (ref 13.0–17.0)
Immature Granulocytes: 0 %
Lymphocytes Relative: 16 %
Lymphs Abs: 1.6 10*3/uL (ref 0.7–4.0)
MCH: 37 pg — ABNORMAL HIGH (ref 26.0–34.0)
MCHC: 35.5 g/dL (ref 30.0–36.0)
MCV: 104.2 fL — ABNORMAL HIGH (ref 80.0–100.0)
Monocytes Absolute: 0.7 10*3/uL (ref 0.1–1.0)
Monocytes Relative: 7 %
Neutro Abs: 7.5 10*3/uL (ref 1.7–7.7)
Neutrophils Relative %: 76 %
Platelets: 253 10*3/uL (ref 150–400)
RBC: 4.05 MIL/uL — ABNORMAL LOW (ref 4.22–5.81)
RDW: 14.7 % (ref 11.5–15.5)
WBC: 10 10*3/uL (ref 4.0–10.5)
nRBC: 0 % (ref 0.0–0.2)

## 2023-01-21 MED ORDER — OXYCODONE-ACETAMINOPHEN 5-325 MG PO TABS
1.0000 | ORAL_TABLET | Freq: Three times a day (TID) | ORAL | 0 refills | Status: AC | PRN
  Filled 2023-01-21: qty 15, 5d supply, fill #0

## 2023-01-21 MED ORDER — OXYCODONE-ACETAMINOPHEN 5-325 MG PO TABS
1.0000 | ORAL_TABLET | Freq: Once | ORAL | Status: AC
  Administered 2023-01-21: 1 via ORAL
  Filled 2023-01-21: qty 1

## 2023-01-21 NOTE — ED Notes (Signed)
PT taken up to pharmacy to obtain medications from the [facility] pharmacy

## 2023-01-21 NOTE — ED Provider Notes (Signed)
[place] EMERGENCY DEPARTMENT AT [facility] Provider Note   CSN: [id number] Arrival date & time: 01/21/23  1013     History  Chief Complaint  Patient presents with   Leg Pain    Craig Blankenship is a 27 y.o. male with history of DVT and PE presenting to the ED with complaint of left leg pain and swelling.  Patient reports this began about 3 days ago with pain and swelling of his left lower leg.  He was seen in the ED yesterday related DVT vascular ultrasound was notable for a popliteal thrombosis.  This noted to be "likely chronic", however patient's most recent DVT vascular ultrasound prior to that was in 2020, when he had a left common femoral occlusion but no popliteal occlusion.  The patient had been on anticoagulation at that time but says he has been off of it as he could not afford it.  Yesterday in the ED he was provided a 30-day supply and restarted on Eliquis.  He was also started on Keflex for potential cellulitis as there was some mild erythema and redness involving the skin around the site.  Patient returns today reporting that his swelling has gotten worse and he is having difficulty sleeping due to pain in his leg.  The patient is adamant that he is not having any of the prior symptoms of his PE from several years ago.  He reports is not having any dyspnea or shortness of breath or lightheadedness.  HPI     Home Medications Prior to Admission medications   Medication Sig Start Date End Date Taking? Authorizing Provider  oxyCODONE-acetaminophen (PERCOCET/ROXICET) 5-325 MG tablet Take 1 tablet by mouth every 8 (eight) hours as needed for up to 15 doses for severe pain (pain score 7-10). 01/21/23  Yes Makayla Lanter, Kermit Balo, MD  acetaminophen (TYLENOL) 325 MG tablet Take 2 tablets (650 mg total) by mouth every 6 (six) hours as needed for mild pain (or Fever >/= 101). Patient not taking: Reported on 02/25/2019 01/30/19   Thomasenia Bottoms, MD  APIXABAN Everlene Balls) VTE  STARTER PACK (10MG  AND 5MG ) Take as directed on package: start with two-5mg  tablets twice daily for 7 days. On day 8, switch to one-5mg  tablet twice daily. 01/20/23   Laurence Spates, MD  cephALEXin (KEFLEX) 500 MG capsule Take 1 capsule (500 mg total) by mouth 4 (four) times daily for 7 days. 01/20/23 01/27/23  Laurence Spates, MD  doxycycline (VIBRAMYCIN) 100 MG capsule Take 1 capsule (100 mg total) by mouth 2 (two) times daily. 11/28/19   Bast, Gloris Manchester A, NP  ondansetron (ZOFRAN) 4 MG tablet Take 1 tablet (4 mg total) by mouth every 6 (six) hours as needed for nausea. Patient not taking: Reported on 02/25/2019 01/30/19   Thomasenia Bottoms, MD      Allergies    Patient has no known allergies.    Review of Systems   Review of Systems  Physical Exam Updated Vital Signs BP 121/71 (BP Location: Right Arm)   Pulse (!) 109   Temp 98.1 F (36.7 C) (Oral)   Resp 18   Ht 6\' 1"  (1.854 m)   Wt 108.9 kg   SpO2 97%   BMI 31.66 kg/m  Physical Exam Constitutional:      General: He is not in acute distress. HENT:     Head: Normocephalic and atraumatic.  Eyes:     Conjunctiva/sclera: Conjunctivae normal.     Pupils: Pupils are equal, round, and reactive  to light.  Cardiovascular:     Rate and Rhythm: Normal rate and regular rhythm.  Pulmonary:     Effort: Pulmonary effort is normal. No respiratory distress.  Abdominal:     General: There is no distension.     Tenderness: There is no abdominal tenderness.  Musculoskeletal:     Comments: Edema of the left lower leg distal to the knee, no significant joint effusion left knee, no significant tenderness of the posterior calf, some mild erythema noted of the left calf  Skin:    General: Skin is warm and dry.  Neurological:     General: No focal deficit present.     Mental Status: He is alert. Mental status is at baseline.  Psychiatric:        Mood and Affect: Mood normal.        Behavior: Behavior normal.     ED Results / Procedures /  Treatments   Labs (all labs ordered are listed, but only abnormal results are displayed) Labs Reviewed  CBC WITH DIFFERENTIAL/PLATELET - Abnormal; Notable for the following components:      Result Value   RBC 4.05 (*)    MCV 104.2 (*)    MCH 37.0 (*)    All other components within normal limits  BASIC METABOLIC PANEL - Abnormal; Notable for the following components:   CO2 20 (*)    Glucose, Bld 110 (*)    All other components within normal limits    EKG None  Radiology VAS [place] LOWER EXTREMITY VENOUS (DVT) (7a-7p)  Result Date: 01/21/2023  Lower Venous DVT Study Patient Name:  UTAH Blankenship  Date of Exam:   01/20/2023 Medical Rec #: [id number]           Accession #:    [id number] Date of Birth: [birth year]           Patient Gender: M Patient Age:   19 years Exam Location:  [facility] Procedure:      VAS [place] LOWER EXTREMITY VENOUS (DVT) Referring Phys: Marja Kays DAVIS --------------------------------------------------------------------------------  Indications: Pain.  Risk Factors: DVT Remote history of DVT and PE. Limitations: Poor ultrasound/tissue interface. Comparison Study: 01/24/2019 - Right: There is no evidence of deep vein                   thrombosis in the lower                   extremity. No cystic structure found in the popliteal fossa.                   Left: Findings consistent with acute deep vein thrombosis                   involving the                   left common femoral vein, and left femoral vein. No cystic                   structure found                   in the popliteal fossa. Performing Technologist: Chanda Busing RVT  Examination Guidelines: A complete evaluation includes B-mode imaging, spectral Doppler, color Doppler, and power Doppler as needed of all accessible portions of each vessel. Bilateral testing is considered an integral part of a complete examination. Limited examinations for reoccurring indications may be performed as noted. The  reflux portion  of the exam is performed with the patient in reverse Trendelenburg.  +-----+---------------+---------+-----------+----------+--------------+ RIGHTCompressibilityPhasicitySpontaneityPropertiesThrombus Aging +-----+---------------+---------+-----------+----------+--------------+ CFV  Full           Yes      Yes                                 +-----+---------------+---------+-----------+----------+--------------+   +---------+---------------+---------+-----------+----------+--------------+ LEFT     CompressibilityPhasicitySpontaneityPropertiesThrombus Aging +---------+---------------+---------+-----------+----------+--------------+ CFV      Full           Yes      Yes                                 +---------+---------------+---------+-----------+----------+--------------+ SFJ      Full                                                        +---------+---------------+---------+-----------+----------+--------------+ FV Prox  Full                                                        +---------+---------------+---------+-----------+----------+--------------+ FV Mid   Full                                                        +---------+---------------+---------+-----------+----------+--------------+ FV DistalFull                                                        +---------+---------------+---------+-----------+----------+--------------+ PFV      Full                                                        +---------+---------------+---------+-----------+----------+--------------+ POP      Partial        No       No                   Chronic        +---------+---------------+---------+-----------+----------+--------------+ PTV      Full                                                        +---------+---------------+---------+-----------+----------+--------------+ PERO     Full                                                         +---------+---------------+---------+-----------+----------+--------------+  Soleal   Full                                                        +---------+---------------+---------+-----------+----------+--------------+ Gastroc  Full                                                        +---------+---------------+---------+-----------+----------+--------------+     Summary: RIGHT: - No evidence of common femoral vein obstruction.   LEFT: - Findings consistent with chronic deep vein thrombosis involving the left popliteal vein.  - No cystic structure found in the popliteal fossa.  *See table(s) above for measurements and observations. Electronically signed by Coral Else MD on 01/21/2023 at 10:50:49 AM.    Final     Procedures Procedures    Medications Ordered in ED Medications  oxyCODONE-acetaminophen (PERCOCET/ROXICET) 5-325 MG per tablet 1 tablet (1 tablet Oral Given 01/21/23 1242)    ED Course/ Medical Decision Making/ A&P                                 Medical Decision Making Risk Prescription drug management.   Patient is presenting today with left lower extremity pain and swelling.  I most suspect this is related to a symptomatic thrombosis which is a popliteal thrombosis from his recent ultrasound.  I reviewed the records including his vascular ultrasound in 2020 with his prior thrombosis.  I do not see clear evidence of overlying cellulitis.  There is some skin reddening which could be consistent with this level of swelling and edema from a thrombosis.  The patient's white blood cell count has not changed in any significant manner and he remains afebrile in the ED.  I would continue him on the Keflex for now, she has been taking this for less than 24 hours, we cannot say that he failed this course of antibiotics.  I have a low suspicion for sepsis or deep space infection.  He does have good distal pulses.  He is on appropriate anticoagulation.  Do not see clear  evidence of cerulea dolens at this time  I do think he would benefit from a referral to both vascular surgery and hematology given unprovoked DVT and pain of symptoms.  I spoke to Dr Myra Gianotti from vascular surgery regarding this distal DVT, and he does recommend follow-up in the DVT clinic.  This information was provided to the patient.  No indication for thrombectomy.  He does not have signs or symptoms of pulmonary embolism at this time, including chest pain or difficulty breathing, and he is quite adamant that he is not having any of the symptoms he had with his prior PE.  I have a low suspicion for a large centralized PE with this clinical presentation.  He is not hypoxic.  We did discuss a further workup which included CT angiogram but he would prefer not to do this at this time and I think this is reasonable.  He is already being anticoagulated.        Final Clinical Impression(s) / ED Diagnoses Final diagnoses:  Deep vein thrombosis (DVT) of popliteal  vein of left lower extremity, unspecified chronicity (HCC)    Rx / DC Orders ED Discharge Orders          Ordered    oxyCODONE-acetaminophen (PERCOCET/ROXICET) 5-325 MG tablet  Every 8 hours PRN        01/21/23 1253    Ambulatory referral to Hematology / Oncology        01/21/23 1254    AMB Referral to Deep Vein Thrombosis Clinic       Comments: Please call [phone] with any questions.   01/21/23 1325              Terald Sleeper, MD 01/21/23 [phone]

## 2023-01-21 NOTE — ED Triage Notes (Signed)
Patient was seen here yesterday and started back on eliquis and keflex for blood clot and cellulitis.  Patient reports was told to come back if his leg swelled more or got more painful.  Patient has swelling noted to knee and calf.  No redness noted.  +pedal pulse.

## 2023-01-21 NOTE — Discharge Instructions (Addendum)
You have a blood clot behind your left knee that was diagnosed on an ultrasound.  This may be causing the pain and swelling in your lower leg.  I recommend you try to keep your left leg elevated is much as possible at home.  You can apply heating packs as needed behind the knee.  You can take Tylenol for regular pain and Percocet for severe breakthrough pain.  You should avoid using ibuprofen, Aleve, or Motrin while you are taking blood thinning medicine such as Eliquis.  You will need follow-up in 2 clinics for your blood clot.  The first is the hematology clinic, which specializes in bleeding and clotting disorder.  I placed a referral to this clinic.  If you do not hear from them within 3 business days, please call the number above for Dr Leonides Schanz.  Separately I would recommend that you follow-up in the vascular clinic which specializes in blood clots.  They may be able to help you with pain control and also with affordable options for blood thinners, after you complete your 30-day initial supply.  Please call the vascular clinic number Monday morning at 8 AM to discuss a follow up appointment.  DVT clinic usually happens on Monday.  If you begin having any sudden chest pain, pressure, loss of consciousness, difficulty breathing, please return to the ER.  This may be signs of a clot that is travel to the lungs that needs immediate attention.    Likewise, if your left leg starts to turn blue, purple, white or discolored, with worsening pain, or loss of  feeling in the foot, please return immediately to the ER.  These may be signs of problems with the blood flow to your leg.

## 2023-10-30 ENCOUNTER — Emergency Department (HOSPITAL_COMMUNITY): Payer: Self-pay

## 2023-10-30 ENCOUNTER — Other Ambulatory Visit: Payer: Self-pay

## 2023-10-30 ENCOUNTER — Emergency Department (HOSPITAL_COMMUNITY)
Admission: EM | Admit: 2023-10-30 | Discharge: 2023-10-30 | Disposition: A | Discharge status: Home / Self Care | Payer: Self-pay | Attending: Emergency Medicine | Admitting: Emergency Medicine

## 2023-10-30 ENCOUNTER — Encounter (HOSPITAL_COMMUNITY): Payer: Self-pay

## 2023-10-30 DIAGNOSIS — X501XXA Overexertion from prolonged static or awkward postures, initial encounter: Secondary | ICD-10-CM | POA: Insufficient documentation

## 2023-10-30 DIAGNOSIS — Z7901 Long term (current) use of anticoagulants: Secondary | ICD-10-CM | POA: Insufficient documentation

## 2023-10-30 DIAGNOSIS — Y9372 Activity, wrestling: Secondary | ICD-10-CM | POA: Insufficient documentation

## 2023-10-30 DIAGNOSIS — S8992XA Unspecified injury of left lower leg, initial encounter: Secondary | ICD-10-CM | POA: Insufficient documentation

## 2023-10-30 MED ORDER — IBUPROFEN 200 MG PO TABS
400.0000 mg | ORAL_TABLET | Freq: Once | ORAL | Status: AC
  Administered 2023-10-30 (×2): 400 mg via ORAL
  Filled 2023-10-30: qty 2

## 2023-10-30 MED ORDER — ACETAMINOPHEN 500 MG PO TABS
1000.0000 mg | ORAL_TABLET | Freq: Once | ORAL | Status: AC
  Administered 2023-10-30 (×2): 1000 mg via ORAL
  Filled 2023-10-30: qty 2

## 2023-10-30 NOTE — ED Triage Notes (Signed)
 Pt states that he was wrestling around with his friend 2 nights ago and heard a pop in his L knee. Pain and swelling noted to area.

## 2023-10-30 NOTE — ED Provider Notes (Signed)
 [place] EMERGENCY DEPARTMENT AT [facility] Provider Note  CSN: [id number] Arrival date & time: 10/30/23 1642  Chief Complaint(s) Knee Injury  HPI Craig Blankenship is a 28 y.o. male who is here today for pain in his left knee.  Patient says that he was wrestling with friends 2 nights ago, felt something pop.  He previously had repair of this knee for an ACL injury at [facility] in 2016.  Injured while playing football.   Past Medical History Past Medical History:  Diagnosis Date   DVT (deep venous thrombosis) (HCC)    Marijuana use    Pulmonary embolism [facility])    Patient Active Problem List   Diagnosis Date Noted   Pulmonary embolism (HCC) 01/24/2019   DVT of left leg 01/24/2019   Syncope and collapse 01/24/2019   Hyperglycemia 01/24/2019   AKI (acute kidney injury) (HCC) 01/24/2019   Marijuana use 01/24/2019   Home Medication(s) Prior to Admission medications   Medication Sig Start Date End Date Taking? Authorizing Provider  acetaminophen  (TYLENOL ) 325 MG tablet Take 2 tablets (650 mg total) by mouth every 6 (six) hours as needed for mild pain (or Fever >/= 101). Patient not taking: Reported on 02/25/2019 01/30/19   Bertell Denise, MD  APIXABAN  (ELIQUIS ) VTE STARTER PACK (10MG  AND 5MG ) Take as directed on package: start with two-5mg  tablets twice daily for 7 days. On day 8, switch to one-5mg  tablet twice daily. 01/20/23   Davis, Jonathon H, MD  doxycycline  (VIBRAMYCIN ) 100 MG capsule Take 1 capsule (100 mg total) by mouth 2 (two) times daily. 11/28/19   Adah Corning A, FNP  ondansetron  (ZOFRAN ) 4 MG tablet Take 1 tablet (4 mg total) by mouth every 6 (six) hours as needed for nausea. Patient not taking: Reported on 02/25/2019 01/30/19   Bertell Denise, MD  oxyCODONE -acetaminophen  (PERCOCET/ROXICET) 5-325 MG tablet Take 1 tablet by mouth every 8 (eight) hours as needed for up to 15 doses for severe pain (pain score 7-10). 01/21/23   Cottie Donnice PARAS, MD                                                                                                                                     Past Surgical History Past Surgical History:  Procedure Laterality Date   IR ANGIOGRAM PULMONARY BILATERAL SELECTIVE  01/24/2019   IR ANGIOGRAM SELECTIVE EACH ADDITIONAL VESSEL  01/24/2019   IR ANGIOGRAM SELECTIVE EACH ADDITIONAL VESSEL  01/24/2019   IR INFUSION THROMBOL ARTERIAL INITIAL (MS)  01/24/2019   IR INFUSION THROMBOL ARTERIAL INITIAL (MS)  01/24/2019   IR THROMB F/U EVAL ART/VEN FINAL DAY (MS)  01/25/2019   IR US  GUIDE VASC ACCESS RIGHT  01/24/2019   Left knee surgery     MASTECTOMY     Family History Family History  Problem Relation Age of Onset   Healthy Mother    Healthy Father  Social History Social History   Tobacco Use   Smoking status: Never   Smokeless tobacco: Never  Vaping Use   Vaping status: Never Used  Substance Use Topics   Alcohol use: Yes    Alcohol/week: 10.0 standard drinks of alcohol    Types: 10 Cans of beer per week   Drug use: Yes    Types: Marijuana   Allergies Patient has no known allergies.  Review of Systems Review of Systems  Physical Exam Vital Signs  I have reviewed the triage vital signs BP (!) 161/84   Pulse 69   Temp 98.2 F (36.8 C) (Oral)   Resp 17   Ht 6' 1 (1.854 m)   Wt 109 kg   SpO2 99%   BMI 31.70 kg/m   Physical Exam Vitals and nursing note reviewed.  Cardiovascular:     Rate and Rhythm: Normal rate.     Pulses: Normal pulses.  Musculoskeletal:     Comments: Positive Lachman test.  Negative McMurry test.  Good popliteal pulse.  Skin:    General: Skin is warm.     Coloration: Skin is not pale.     Findings: No erythema.     ED Results and Treatments Labs (all labs ordered are listed, but only abnormal results are displayed) Labs Reviewed - No data to display                                                                                                                         Radiology DG Knee Complete 4 Views Left Result Date: 10/30/2023 CLINICAL DATA:  Pain, heard a pop.  Swelling. EXAM: LEFT KNEE - COMPLETE 4+ VIEW COMPARISON:  None Available. FINDINGS: Postsurgical change with tunnels typical of prior ACL repair. Hardware is intact. No acute fracture or dislocation. There is a large joint effusion. No erosive or bony destructive change. IMPRESSION: 1. Large joint effusion. No acute fracture or dislocation. 2. Postsurgical change of prior ACL repair. Electronically Signed   By: Andrea Gasman M.D.   On: 10/30/2023 17:38    Pertinent labs & imaging results that were available during my care of the patient were reviewed by me and considered in my medical decision making (see MDM for details).  Medications Ordered in ED Medications  acetaminophen  (TYLENOL ) tablet 1,000 mg (has no administration in time range)  ibuprofen  (ADVIL ) tablet 400 mg (has no administration in time range)  Procedures Procedures  (including critical care time)  Medical Decision Making / ED Course   This patient presents to the ED for concern of knee pain, this involves an extensive number of treatment options, and is a complaint that carries with it a high risk of complications and morbidity.  The differential diagnosis includes ACL injury, meniscal injury.  MDM: Patient with some anterior laxity of the left knee.  Patient's knee is overall stable.  Plain films do not show fracture.  No vascular injury.  Likely ACL injury.  Placed in knee immobilizer, crutches provided.  Discharge with orthopedic follow-up.   Additional history obtained:  -External records from outside source obtained and reviewed including: Chart review including previous notes, labs, imaging, consultation notes   Lab Tests: -I ordered, reviewed, and interpreted labs.   The  pertinent results include:   Labs Reviewed - No data to display      Imaging Studies ordered: I ordered imaging studies including knee x-ray I independently visualized and interpreted imaging. I agree with the radiologist interpretation   Medicines ordered and prescription drug management: Meds ordered this encounter  Medications   acetaminophen  (TYLENOL ) tablet 1,000 mg   ibuprofen  (ADVIL ) tablet 400 mg    -I have reviewed the patients home medicines and have made adjustments as needed   Cardiac Monitoring: The patient was maintained on a cardiac monitor.  I personally viewed and interpreted the cardiac monitored which showed an underlying rhythm of: Normal sinus rhythm  Social Determinants of Health:  Factors impacting patients care include: Lack of access to primary care   Reevaluation: After the interventions noted above, I reevaluated the patient and found that they have :improved  Co morbidities that complicate the patient evaluation  Past Medical History:  Diagnosis Date   DVT (deep venous thrombosis) (HCC)    Marijuana use    Pulmonary embolism (HCC)        Final Clinical Impression(s) / ED Diagnoses Final diagnoses:  Injury of left knee, initial encounter     @PCDICTATION @    Mannie Pac T, DO 10/30/23 1749

## 2023-10-30 NOTE — Discharge Instructions (Addendum)
 I think he may have reinjured your ACL.  You have been provided crutches and a knee immobilizer.  Use the knee immobilizer when you are out moving around.  You may take it off to shower, or to sleep.  Please call Dr. [place] office tomorrow for follow-up appointment.  You can take 4 mg of ibuprofen  every 6 hours, 1000 mg of Tylenol  every 8 hours.

## 2023-10-30 NOTE — Progress Notes (Signed)
 Orthopedic Tech Progress Note Patient Details:  Craig Blankenship 06/30/1995 [id number]  Ortho Devices Type of Ortho Device: Knee Immobilizer, Crutches Ortho Device/Splint Interventions: Ordered, Application, Adjustment   Post Interventions Patient Tolerated: Well Instructions Provided: Care of device, Poper ambulation with device  [name] 10/30/2023, 6:38 PM
